# Patient Record
Sex: Female | Born: 1970 | Race: Black or African American | Hispanic: No | Marital: Married | Smoking: Never smoker
Health system: Southern US, Community
[De-identification: ages and names within clinical notes are randomized; demographics above are authoritative.]

## PROBLEM LIST (undated history)

## (undated) DIAGNOSIS — Z8719 Personal history of other diseases of the digestive system: Secondary | ICD-10-CM

## (undated) DIAGNOSIS — Z5189 Encounter for other specified aftercare: Secondary | ICD-10-CM

## (undated) DIAGNOSIS — K5090 Crohn's disease, unspecified, without complications: Secondary | ICD-10-CM

## (undated) DIAGNOSIS — R0602 Shortness of breath: Secondary | ICD-10-CM

## (undated) DIAGNOSIS — K219 Gastro-esophageal reflux disease without esophagitis: Secondary | ICD-10-CM

## (undated) DIAGNOSIS — I10 Essential (primary) hypertension: Secondary | ICD-10-CM

## (undated) HISTORY — PX: OTHER SURGICAL HISTORY: SHX169

---

## 1999-12-01 DIAGNOSIS — Z5189 Encounter for other specified aftercare: Secondary | ICD-10-CM

## 1999-12-01 DIAGNOSIS — IMO0001 Reserved for inherently not codable concepts without codable children: Secondary | ICD-10-CM

## 1999-12-01 HISTORY — DX: Encounter for other specified aftercare: Z51.89

## 1999-12-01 HISTORY — DX: Reserved for inherently not codable concepts without codable children: IMO0001

## 2001-02-15 ENCOUNTER — Emergency Department (HOSPITAL_COMMUNITY): Admission: EM | Admit: 2001-02-15 | Discharge: 2001-02-15 | Payer: Self-pay | Admitting: *Deleted

## 2001-02-19 ENCOUNTER — Inpatient Hospital Stay (HOSPITAL_COMMUNITY): Admission: AD | Admit: 2001-02-19 | Discharge: 2001-02-19 | Payer: Self-pay | Admitting: *Deleted

## 2001-03-17 ENCOUNTER — Encounter: Payer: Self-pay | Admitting: Obstetrics

## 2001-03-17 ENCOUNTER — Inpatient Hospital Stay (HOSPITAL_COMMUNITY): Admission: AD | Admit: 2001-03-17 | Discharge: 2001-03-17 | Payer: Self-pay | Admitting: *Deleted

## 2001-05-20 ENCOUNTER — Inpatient Hospital Stay (HOSPITAL_COMMUNITY): Admission: AD | Admit: 2001-05-20 | Discharge: 2001-05-20 | Payer: Self-pay | Admitting: *Deleted

## 2001-10-03 ENCOUNTER — Inpatient Hospital Stay: Admission: AD | Admit: 2001-10-03 | Discharge: 2001-10-03 | Payer: Self-pay | Admitting: Obstetrics and Gynecology

## 2001-10-06 ENCOUNTER — Inpatient Hospital Stay (HOSPITAL_COMMUNITY): Admission: AD | Admit: 2001-10-06 | Discharge: 2001-10-09 | Payer: Self-pay | Admitting: Obstetrics and Gynecology

## 2001-10-06 ENCOUNTER — Inpatient Hospital Stay (HOSPITAL_COMMUNITY): Admission: AD | Admit: 2001-10-06 | Discharge: 2001-10-06 | Payer: Self-pay | Admitting: Obstetrics and Gynecology

## 2002-04-14 ENCOUNTER — Other Ambulatory Visit: Admission: RE | Admit: 2002-04-14 | Discharge: 2002-04-14 | Payer: Self-pay | Admitting: Obstetrics and Gynecology

## 2002-05-29 ENCOUNTER — Encounter: Admission: RE | Admit: 2002-05-29 | Discharge: 2002-08-27 | Payer: Self-pay | Admitting: Internal Medicine

## 2002-11-15 ENCOUNTER — Emergency Department (HOSPITAL_COMMUNITY): Admission: EM | Admit: 2002-11-15 | Discharge: 2002-11-16 | Payer: Self-pay | Admitting: Emergency Medicine

## 2003-01-31 ENCOUNTER — Encounter: Payer: Self-pay | Admitting: Emergency Medicine

## 2003-01-31 ENCOUNTER — Emergency Department (HOSPITAL_COMMUNITY): Admission: EM | Admit: 2003-01-31 | Discharge: 2003-01-31 | Payer: Self-pay | Admitting: Emergency Medicine

## 2003-09-28 ENCOUNTER — Encounter: Admission: RE | Admit: 2003-09-28 | Discharge: 2003-09-28 | Payer: Self-pay | Admitting: Internal Medicine

## 2005-12-29 ENCOUNTER — Inpatient Hospital Stay (HOSPITAL_COMMUNITY): Admission: AD | Admit: 2005-12-29 | Discharge: 2005-12-29 | Payer: Self-pay | Admitting: Obstetrics

## 2006-07-04 ENCOUNTER — Encounter (INDEPENDENT_AMBULATORY_CARE_PROVIDER_SITE_OTHER): Payer: Self-pay | Admitting: *Deleted

## 2006-07-04 ENCOUNTER — Inpatient Hospital Stay (HOSPITAL_COMMUNITY): Admission: AD | Admit: 2006-07-04 | Discharge: 2006-07-06 | Payer: Self-pay | Admitting: Obstetrics & Gynecology

## 2006-07-05 ENCOUNTER — Encounter (INDEPENDENT_AMBULATORY_CARE_PROVIDER_SITE_OTHER): Payer: Self-pay | Admitting: *Deleted

## 2006-07-22 ENCOUNTER — Inpatient Hospital Stay (HOSPITAL_COMMUNITY): Admission: AD | Admit: 2006-07-22 | Discharge: 2006-07-25 | Payer: Self-pay | Admitting: Obstetrics

## 2008-09-25 ENCOUNTER — Other Ambulatory Visit: Admission: RE | Admit: 2008-09-25 | Discharge: 2008-09-25 | Payer: Self-pay | Admitting: Family Medicine

## 2008-10-24 ENCOUNTER — Encounter: Admission: RE | Admit: 2008-10-24 | Discharge: 2008-10-24 | Payer: Self-pay | Admitting: Family Medicine

## 2009-02-28 ENCOUNTER — Encounter: Admission: RE | Admit: 2009-02-28 | Discharge: 2009-02-28 | Payer: Self-pay | Admitting: Family Medicine

## 2011-04-17 NOTE — Op Note (Signed)
NAME:  [name], [name]                 ACCOUNT NO.:  [id number]   MEDICAL RECORD NO.:  [id number]          PATIENT TYPE:  INP   LOCATION:  9115                          FACILITY:  [facility]   PHYSICIAN:  Charles A. Clearance Coots, M.D.DATE OF BIRTH:  [birth year]   DATE OF PROCEDURE:  07/06/2006  DATE OF DISCHARGE:                                 OPERATIVE REPORT   PREOPERATIVE DIAGNOSIS:  Desires permanent sterilization.   POSTOPERATIVE DIAGNOSIS:  Desires permanent sterilization.   PROCEDURE:  Bilateral partial salpingectomy (Pomeroy technique).   ANESTHESIA:  Epidural.   ESTIMATED BLOOD LOSS:  Negligible.   COMPLICATIONS:  None.   SPECIMENS:  Approximately 2-cm segments of right and left fallopian tubes.   OPERATION:  The patient was brought to the operating room and after  satisfactory redosing of the epidural, the abdomen was prepped and draped in  the usual sterile fashion.  A small inferior umbilical incision was made  with the scalpel that was deepened down to the fascia with curved Mayo  scissors.  The fascia was grasped in the midline with Kelley forceps and was  cut transversely with curved Mayo scissors.  Fascial incision was extended  to the left and to the right with curved Mayo scissors.  Peritoneum was  grasped with hemostats and was incised with Metzenbaum scissors and right-  angle retractors were placed in the incision.  The right fallopian tube was  identified.  It was grasped with the Babcock clamp.  Tube was followed from  the cornual end of the fimbrial end and grasped with  Babcock clamps and was  then regrasped in the isthmic portion of the tube with the Babcock clamp.  A  knuckle of tube beneath the Babcock clamp was doubly ligated with #1 plain  catgut and the section of tube above the knot was excised with Metzenbaum  scissors and submitted to pathology for evaluation.  There was no active  bleeding from the tubal stumps and they were, therefore, placed back in  their  normal anatomic position.  The same procedure was performed on the  opposite side without complications.  The peritoneum and fascia was then  grasped with Nicholaus Bloom forceps and the peritoneum and fascia were closed as one  with a continuous suture of 2-0 Vicryl.  The subcutaneous tissue was  approximated with interrupted sutures of 2-0 Vicryl.  The skin was closed  with continuous subcuticular suture of 3-0 Monocryl.  Sterile bandage was  applied to the incision closure.  The patient tolerated the procedure well  and was transported to the recovery room in satisfactory condition.      Charles A. Clearance Coots, M.D.  Electronically Signed     CAH/MEDQ  D:  07/06/2006  T:  07/06/2006  Job:  [id number]

## 2011-04-17 NOTE — Discharge Summary (Signed)
NAME:  [name], [name]                 ACCOUNT NO.:  [id number]   MEDICAL RECORD NO.:  [id number]          PATIENT TYPE:  INP   LOCATION:  9319                          FACILITY:  [facility]   PHYSICIAN:  Roseanna Rainbow, M.D.DATE OF BIRTH:  1971/04/06   DATE OF ADMISSION:  07/22/2006  DATE OF DISCHARGE:  07/25/2006                                 DISCHARGE SUMMARY   CHIEF COMPLAINT:  The patient is a 40 year old with elevated blood pressures  and visual disturbances.   HISTORY OF PRESENT ILLNESS:  The patient is status post a spontaneous  vaginal delivery on July 04, 2006.  She also has a history of chronic  hypertension.  The patient reports scotomata.  She denies any other  neurological symptoms.   ALLERGIES:  NO KNOWN DRUG ALLERGIES.   MEDICATIONS:  Prenatal vitamins, Keflex.   PAST SURGICAL HISTORY:  Tonsils and adenoids.   PAST MEDICAL HISTORY:  Please see the above.   PHYSICAL EXAM:  VITAL SIGNS: Temperature 99, pulse 66, respiratory rate 16,  blood pressure 183/103.  GENERAL APPEARANCE:  Well-nourished, well-developed.  HEAD, EYES, EARS, NOSE AND THROAT:  Normocephalic.  NECK:  Supple.  LUNGS: Clear to auscultation bilaterally.  HEART:  Regular rate and rhythm.  ABDOMEN:  Nontender.  PELVIC:  Exam deferred.  EXTREMITIES:  No edema.  Deep tendon reflexes, patellar 2+.  SKIN:  Without rash.   LABS:  SGOT, SGPT 43 and 27 respectively.  Uric acid 6, hemoglobin 13,  platelets 411,000.   ASSESSMENT:  Preeclampsia postpartum.   PLAN:  Was admission and parenteral magnesium sulfate for seizure  prophylaxis.   HOSPITAL COURSE:  The patient was admitted.  She also was complaining of  migraine headache which responded to p.o. analgesics.  Her blood pressures  were of 130s to 140s/70s to 90s on hospital day #1.  The magnesium sulfate  was continued.  On hospital day #2, she complained of epigastric pain and  nausea and vomiting.  She had some periumbilical tenderness on  exam.  PIH  labs were continued and an abdominal ultrasound was obtained.  She also has  a history of Crohn's disease that has been quiescent.  The gastrointestinal  complaints resolved.  The magnesium sulfate was discontinued.  Her abdominal  ultrasound was normal.  Her PIH labs were normal.  Magnesium level was 8.2  prior to discontinuing the magnesium.  She was subsequently discharged to  home.   DISCHARGE DIAGNOSIS:  Postpartum preeclampsia, condition stable.   DIET:  Regular.   ACTIVITY:  Modified bed rest.   MEDICATIONS:  Resume home medications, Dilaudid as needed for headache.   DISPOSITION:  The patient was to follow up in the office in several days.      Roseanna Rainbow, M.D.  Electronically Signed     LAJ/MEDQ  D:  08/13/2006  T:  08/13/2006  Job:  [id number]

## 2011-04-17 NOTE — H&P (Signed)
[facility]  PatientJASSMINE, [name] Visit Number: [id number] MRN: [id number]          Service Type: OBS Location: [facility] Attending Physician:  Jaymes Graff A Dictated by:   Saverio Danker, C.N.M. Admit Date:  10/06/2001                           History and Physical  CHIEF COMPLAINT:              [name] is a 40 year old married black female, gravida 2 para 0, 0-1-0, at 37 weeks, who presents complaining of leaking copious amounts of clear fluid at approximately 10 p.m. this evening.  HISTORY OF PRESENT ILLNESS:   She denies any headaches or blurred vision, and she denies any nausea or vomiting.  She does report some mild irregular uterine contractions.  She reports positive fetal movement.  Her pregnancy has been followed at [facility] by the M.D. service and has been at risk for a history of positive group B strep and history of chronic hypertension, with normal PIH laboratories today, negative proteinuria today, and history of Crohns disease.  PAST OB/GYN HISTORY:          1. Gravida 2 para 0, 0-1-0.                               2. SAB or miscarriage in 1989 with no                                  complications.  ALLERGIES:                    No known drug allergies.  PAST MEDICAL HISTORY:         1. She reports having had the usual childhood                                  diseases.                               2. She reports a history of chronic hypertension                                  and was on meds prior to this pregnancy.                               3. She also reports a history of anemia and was                                  on iron prior to this pregnancy, and did                                  receive blood transfusion three times - at  age 69, 51, and 89, for Crohns disease.  PAST SURGICAL HISTORY:        1. Tonsillectomy at age 64.  2. D&C in 59.                               3. Wisdom teeth, age 74.  FAMILY HISTORY:               Significant for multiple members with chronic hypertension.  Mother with varicosities.  Maternal first cousin with prostate cancer.  Paternal grandfather and paternal uncle with migraines.  GENETIC HISTORY:              Her genetic history is essentially negative.  SOCIAL HISTORY:               She is married to Ivory Broad, who is involved and supportive.  They are of the [place] faith.  They deny any illicit drug use, alcohol, or smoking with this pregnancy.  PRENATAL LABORATORY DATA:     Her blood type is O-positive.  Her antibody screen is negative.  Sickle cell trait is negative.  Syphilis nonreactive. Rubella immune.  Hepatitis B surface antigen negative.  HIV nonreactive. Group B strep positive.  GC and Chlamydia both negative.  Pap smear within normal limits.  One hour glucola within normal range.  Maternal serum alpha fetoprotein was also within normal range.  PHYSICAL EXAMINATION:  VITAL SIGNS:                  Stable.  She is afebrile.  Her blood pressure on admission is 145/101 and a few minutes after admission 128/92.  HEENT:                        Grossly within normal limits.  HEART:                        Regular rhythm and rate.  CHEST:                        Clear.  BREAST:                       Soft, nontender.  ABDOMEN:                      Gravid with uterine contractions every 2-1/2 to 4-1/2 minutes that are mild.  PELVIC:                       Cervix 2 cm, 80%, vertex -2 with positive ferning and nitrazine, and copious fluid noted.  EXTREMITIES:                  Within normal limits.  ASSESSMENT:                   1. Intrauterine pregnancy at term.                               2. Positive group B streptococci.                               3. Spontaneous rupture of membranes with  clear fluid at 10 p.m.                                4. Chronic hypertension.  PLAN:                         Admit to labor and delivery to follow routine M.D. orders, give penicillin for group B strep prophylaxis.  Dr. Dierdre Forth has been notified of the patients admission and will follow the patient. Dictated by:   Vance Gather Duplantis, C.N.M. Attending Physician:  Michael Litter DD:  10/06/01 TD:  10/07/01 Job: [id number]

## 2011-12-09 ENCOUNTER — Other Ambulatory Visit: Payer: Self-pay | Admitting: Obstetrics and Gynecology

## 2011-12-09 DIAGNOSIS — Z1231 Encounter for screening mammogram for malignant neoplasm of breast: Secondary | ICD-10-CM

## 2011-12-21 ENCOUNTER — Ambulatory Visit
Admission: RE | Admit: 2011-12-21 | Discharge: 2011-12-21 | Disposition: A | Discharge status: Home / Self Care | Payer: PRIVATE HEALTH INSURANCE | Source: Ambulatory Visit | Attending: Obstetrics and Gynecology | Admitting: Obstetrics and Gynecology

## 2011-12-21 DIAGNOSIS — Z1231 Encounter for screening mammogram for malignant neoplasm of breast: Secondary | ICD-10-CM

## 2012-02-15 ENCOUNTER — Other Ambulatory Visit: Payer: Self-pay | Admitting: Urology

## 2012-04-04 ENCOUNTER — Encounter (HOSPITAL_COMMUNITY): Payer: Self-pay | Admitting: Pharmacist

## 2012-04-13 ENCOUNTER — Encounter (HOSPITAL_COMMUNITY)
Admission: RE | Admit: 2012-04-13 | Discharge: 2012-04-13 | Disposition: A | Discharge status: Home / Self Care | Payer: PRIVATE HEALTH INSURANCE | Source: Ambulatory Visit | Attending: Obstetrics and Gynecology | Admitting: Obstetrics and Gynecology

## 2012-04-13 ENCOUNTER — Encounter (HOSPITAL_COMMUNITY): Payer: Self-pay

## 2012-04-13 HISTORY — DX: Crohn's disease, unspecified, without complications: K50.90

## 2012-04-13 HISTORY — DX: Shortness of breath: R06.02

## 2012-04-13 HISTORY — DX: Encounter for other specified aftercare: Z51.89

## 2012-04-13 HISTORY — DX: Personal history of other diseases of the digestive system: Z87.19

## 2012-04-13 HISTORY — DX: Essential (primary) hypertension: I10

## 2012-04-13 HISTORY — DX: Gastro-esophageal reflux disease without esophagitis: K21.9

## 2012-04-13 LAB — BASIC METABOLIC PANEL
BUN: 12 mg/dL (ref 6–23)
CO2: 24 mEq/L (ref 19–32)
Calcium: 9.5 mg/dL (ref 8.4–10.5)
Chloride: 102 mEq/L (ref 96–112)
Creatinine, Ser: 0.93 mg/dL (ref 0.50–1.10)
Glucose, Bld: 93 mg/dL (ref 70–99)

## 2012-04-13 LAB — CBC
HCT: 37.2 % (ref 36.0–46.0)
MCH: 29.4 pg (ref 26.0–34.0)
MCV: 90.5 fL (ref 78.0–100.0)
Platelets: 412 10*3/uL — ABNORMAL HIGH (ref 150–400)
RBC: 4.11 MIL/uL (ref 3.87–5.11)

## 2012-04-13 NOTE — Patient Instructions (Addendum)
19 [name]  04/13/2012   Your procedure is scheduled on:  04/22/12  Enter through the Main Entrance of [facility] at 6 AM.  Pick up the phone at the desk and dial [phone].   Call this number if you have problems the morning of surgery: [phone]   Remember:   Do not eat food:After Midnight.  Do not drink clear liquids: After Midnight.  Take these medicines the morning of surgery with A SIP OF WATER: HCTZ   Do not wear jewelry, make-up or nail polish.  Do not wear lotions, powders, or perfumes. You may wear deodorant.  Do not shave 48 hours prior to surgery.  Do not bring valuables to the hospital.  Contacts, dentures or bridgework may not be worn into surgery.  Leave suitcase in the car. After surgery it may be brought to your room.  For patients admitted to the hospital, checkout time is 11:00 AM the day of discharge.   Patients discharged the day of surgery will not be allowed to drive home.  Name and phone number of your driver: NA  Special Instructions: CHG Shower Use Special Wash: 1/2 bottle night before surgery and 1/2 bottle morning of surgery.   Please read over the following fact sheets that you were given: MRSA Information

## 2012-04-13 NOTE — Pre-Procedure Instructions (Addendum)
Call to anes informing Dr. Jean Rosenthal of pts complaint of shortness of breath when climbing stairs and non-compliance in taking HCTZ.  Diastolics in the high 90's. No anes. Pre-op interview needed per Brayton Caves, MD  Patton Salles, RN    Instructed patient to take BP medication as prescribed.  Patton Salles RN

## 2012-04-21 MED ORDER — CEFAZOLIN SODIUM-DEXTROSE 2-3 GM-% IV SOLR
2.0000 g | INTRAVENOUS | Status: AC
  Administered 2012-04-22: 2 g via INTRAVENOUS
  Filled 2012-04-21: qty 50

## 2012-04-21 NOTE — H&P (Signed)
[name] is an 41 y.o. female G2P2 who is coming in for a scheduled LSH and suburethral sling for ongoing issues with menorrhagia and SUI.  Pt began to have heavy regular cycles over the last year with flooding.  Her bleeding then progressed to daily bleeding that has not responded to Aygestin or generess OCP's.  US revealed fibroids and probale adenomyosis.  EMB was benign.  She is seeking definitive surgical therapy with a hysterectomy.  She also reported significant SUI and has had a w/u with Dr. Vernie Ammons so she can proceed with a concurrent sling procedure.  Pertinent Gynecological History: No abnormal paps  Menstrual History:  No LMP recorded.    Past Medical History  Diagnosis Date  . Hypertension   . Blood transfusion [address], [place]  . Shortness of breath   . H/O hiatal hernia   . Crohn's disease   . GERD (gastroesophageal reflux disease)     No meds    Past Surgical History  Procedure Date  . Vag del x2     No family history on file.  Social History:  reports that she has never smoked. She does not have any smokeless tobacco history on file. She reports that she drinks alcohol. She reports that she does not use illicit drugs.  Allergies: No Known Allergies  No prescriptions prior to admission    ROS  There were no vitals taken for this visit. Physical Exam  Constitutional: She is oriented to person, place, and time. She appears well-developed and well-nourished.  Cardiovascular: Normal rate and regular rhythm.   Respiratory: Effort normal and breath sounds normal.  GI: Soft. Bowel sounds are normal.  Genitourinary: Vagina normal and uterus normal.       Uterus ULN  Neurological: She is alert and oriented to person, place, and time.  Psychiatric: She has a normal mood and affect. Her behavior is normal.    No results found for this or any previous visit (from the past 24 hour(s)).  No results found.  Assessment/Plan: The patient was  counseled on risks and benefits of LSH surgery including bleeding, infection and possible damage to bowel and bladder.  Pt will do a bowel prep the night before surgery.  She understands the need to convert to an open approach in the event of a complication and that this would possibly delay recovery. She desires to proceed.  Oliver Pila 04/21/2012, 8:36 PM

## 2012-04-21 NOTE — Consult Note (Signed)
History of Present Illness        [name] is a 41 year old female patient of Dr. Senaida Ores who was seen in office consultation for further evaluation of urinary incontinence. The patient was found, by urodynamics, to have stress urinary incontinence with no detrusor instability. She was found on this study, done in 1/13, to have pure stress urinary incontinence with a normal bladder capacity and no detrusor instability. The patient reports she's having significant bleeding per vagina and is scheduled to undergo a hysterectomy by Dr. Senaida Ores. She's also having very significant stress urinary incontinence that occurs with sneezing, laughing, coughing and heavy lifting. She has no nocturia or daytime urgency. She has no history of chronic UTIs. She has tried Kegel exercises and these have been ineffective for her. Her leakage is of moderate severity with no associated signs and symptoms.   Past Medical History Problems  1. History of  Anemia 285.9 2. History of  Esophageal Reflux 530.81 3. History of  Gastric Ulcer 531.90 4. History of  Hyperlipidemia 272.4 5. History of  Hypertension 401.9  Surgical History Problems  1. History of  No Surgical Problems  Current Meds 1. ALPRAZolam 0.25 MG Oral Tablet; Therapy: (Recorded:06Mar2013) to 2. Benicar 40 MG Oral Tablet; Therapy: (Recorded:06Mar2013) to 3. Centrum Silver TABS; Therapy: (Recorded:06Mar2013) to 4. Iron TABS; Therapy: (Recorded:06Mar2013) to 5. Norethindrone Acetate 5 MG Oral Tablet; Therapy: (Recorded:06Mar2013) to  Allergies No Known Allergies  1. No Known Allergies  Family History Problems  1. Fraternal history of  Cholelithiasis 2. Paternal history of  Diabetes Mellitus V18.0 3. Family history of  Family Health Status - Mother's Age 32. Family history of  Family Health Status Father 5. Family history of  Family Health Status Number Of Children 6. Maternal history of  Hypertension V17.49 7. Sororal history of  Hypertension  V17.49  Social History Problems    Alcohol Use   Caffeine Use   Marital History - Currently Married   Never A Smoker Denied    History of  Occupation:  Review of Systems Genitourinary, constitutional, skin, eye, otolaryngeal, hematologic/lymphatic, cardiovascular, pulmonary, endocrine, musculoskeletal, gastrointestinal, neurological and psychiatric system(s) were reviewed and pertinent findings if present are noted.  Genitourinary: urinary frequency, nocturia and incontinence.  Gastrointestinal: melena.  Constitutional: feeling tired (fatigue).    Vitals Vital Signs BMI Calculated: 27.22 BSA Calculated: 1.56 Height: 4 ft 11 in Weight: 135 lb  Blood Pressure: 143 / 95, RUE, Sitting Temperature: 98.1 F, Oral Heart Rate: 82  Physical Exam Constitutional: Well nourished and well developed . No acute distress.  ENT:. The ears and nose are normal in appearance.  Neck: The appearance of the neck is normal and no neck mass is present.  Pulmonary: No respiratory distress and normal respiratory rhythm and effort.  Cardiovascular: Heart rate and rhythm are normal . No peripheral edema.  Abdomen: The abdomen is soft and nontender. No masses are palpated. No CVA tenderness. No hernias are palpable. No hepatosplenomegaly noted.  Lymphatics: The femoral and inguinal nodes are not enlarged or tender.  Skin: Normal skin turgor, no visible rash and no visible skin lesions.  Neuro/Psych:. Mood and affect are appropriate.    Results/Data Urine COLOR YELLOW   APPEARANCE CLEAR   SPECIFIC GRAVITY 1.020   pH 6.0   GLUCOSE NEG mg/dL  BILIRUBIN NEG   KETONE NEG mg/dL  BLOOD TRACE   PROTEIN NEG mg/dL  UROBILINOGEN 0.2 mg/dL  NITRITE NEG   LEUKOCYTE ESTERASE NEG   SQUAMOUS EPITHELIAL/HPF FEW  WBC 0-3 WBC/hpf  RBC 0-3 RBC/hpf  BACTERIA NONE SEEN   CRYSTALS NONE SEEN   CASTS NONE SEEN    The following images/tracing/specimen were independently visualized:  I reviewed her  urodynamics done by Dr. Senaida Ores which revealed his point pressure of 157-175 with urethral hypermobility and he functional bladder capacity of 528 cc. No detrusor instability or outlet obstruction was noted.    Assessment Assessed  1. Female Stress Incontinence 625.6   I have discussed the procedure of the mid urethral sling with the patient and her husband today. I've given her a brochure outlining the procedure as well as its risks and complications and we went over each of the risks and complications individually today. I feel she would be an excellent candidate for a suprapubic sling and this can be performed at the time of her hysterectomy. She understands and would like to proceed with that.   Plan    She will be scheduled for a suprapubic sling in conjunction with her hysterectomy by Dr. Senaida Ores.

## 2012-04-22 ENCOUNTER — Encounter (HOSPITAL_COMMUNITY): Payer: Self-pay | Admitting: Anesthesiology

## 2012-04-22 ENCOUNTER — Ambulatory Visit (HOSPITAL_COMMUNITY): Payer: PRIVATE HEALTH INSURANCE | Admitting: Anesthesiology

## 2012-04-22 ENCOUNTER — Encounter (HOSPITAL_COMMUNITY)
Admission: RE | Disposition: A | Discharge status: Home / Self Care | Payer: Self-pay | Source: Ambulatory Visit | Attending: Obstetrics and Gynecology

## 2012-04-22 ENCOUNTER — Encounter (HOSPITAL_COMMUNITY): Payer: Self-pay | Admitting: *Deleted

## 2012-04-22 ENCOUNTER — Ambulatory Visit (HOSPITAL_COMMUNITY)
Admission: RE | Admit: 2012-04-22 | Discharge: 2012-04-22 | Disposition: A | Discharge status: Home / Self Care | Payer: PRIVATE HEALTH INSURANCE | Source: Ambulatory Visit | Attending: Obstetrics and Gynecology | Admitting: Obstetrics and Gynecology

## 2012-04-22 DIAGNOSIS — N393 Stress incontinence (female) (male): Secondary | ICD-10-CM | POA: Insufficient documentation

## 2012-04-22 DIAGNOSIS — N949 Unspecified condition associated with female genital organs and menstrual cycle: Secondary | ICD-10-CM | POA: Insufficient documentation

## 2012-04-22 DIAGNOSIS — N938 Other specified abnormal uterine and vaginal bleeding: Secondary | ICD-10-CM | POA: Insufficient documentation

## 2012-04-22 DIAGNOSIS — Z90710 Acquired absence of both cervix and uterus: Secondary | ICD-10-CM

## 2012-04-22 DIAGNOSIS — N946 Dysmenorrhea, unspecified: Secondary | ICD-10-CM | POA: Insufficient documentation

## 2012-04-22 DIAGNOSIS — N920 Excessive and frequent menstruation with regular cycle: Secondary | ICD-10-CM | POA: Insufficient documentation

## 2012-04-22 HISTORY — PX: CYSTOSCOPY: SHX5120

## 2012-04-22 HISTORY — PX: LAPAROSCOPIC SUPRACERVICAL HYSTERECTOMY: SHX5399

## 2012-04-22 HISTORY — PX: PUBOVAGINAL SLING: SHX1035

## 2012-04-22 LAB — TYPE AND SCREEN: Antibody Screen: NEGATIVE

## 2012-04-22 SURGERY — HYSTERECTOMY, SUPRACERVICAL, LAPAROSCOPIC
Anesthesia: General | Site: Vagina | Wound class: Clean Contaminated

## 2012-04-22 MED ORDER — ONDANSETRON HCL 4 MG/2ML IJ SOLN
INTRAMUSCULAR | Status: AC
  Filled 2012-04-22: qty 2

## 2012-04-22 MED ORDER — FENTANYL CITRATE 0.05 MG/ML IJ SOLN
INTRAMUSCULAR | Status: DC | PRN
  Administered 2012-04-22: 250 ug via INTRAVENOUS
  Administered 2012-04-22: 100 ug via INTRAVENOUS
  Administered 2012-04-22: 150 ug via INTRAVENOUS

## 2012-04-22 MED ORDER — BUPIVACAINE HCL (PF) 0.25 % IJ SOLN
INTRAMUSCULAR | Status: AC
  Filled 2012-04-22: qty 30

## 2012-04-22 MED ORDER — ONDANSETRON HCL 4 MG/2ML IJ SOLN
INTRAMUSCULAR | Status: DC | PRN
  Administered 2012-04-22: 4 mg via INTRAVENOUS

## 2012-04-22 MED ORDER — MIDAZOLAM HCL 5 MG/5ML IJ SOLN
INTRAMUSCULAR | Status: DC | PRN
  Administered 2012-04-22: 2 mg via INTRAVENOUS

## 2012-04-22 MED ORDER — OXYCODONE-ACETAMINOPHEN 5-325 MG PO TABS: 1.0000 | ORAL_TABLET | ORAL | Status: AC | PRN

## 2012-04-22 MED ORDER — LACTATED RINGERS IR SOLN
Status: DC | PRN
  Administered 2012-04-22: 3000 mL

## 2012-04-22 MED ORDER — SODIUM CHLORIDE 0.9 % IJ SOLN
INTRAMUSCULAR | Status: DC | PRN
  Administered 2012-04-22: 5 mL

## 2012-04-22 MED ORDER — GLYCOPYRROLATE 0.2 MG/ML IJ SOLN
INTRAMUSCULAR | Status: DC | PRN
  Administered 2012-04-22: .5 mg via INTRAVENOUS
  Administered 2012-04-22: 4 mg via INTRAVENOUS

## 2012-04-22 MED ORDER — EPHEDRINE SULFATE 50 MG/ML IJ SOLN
INTRAMUSCULAR | Status: DC | PRN
  Administered 2012-04-22: 10 mg via INTRAVENOUS
  Administered 2012-04-22: 5 mg via INTRAVENOUS

## 2012-04-22 MED ORDER — LIDOCAINE HCL (CARDIAC) 20 MG/ML IV SOLN
INTRAVENOUS | Status: AC
  Filled 2012-04-22: qty 5

## 2012-04-22 MED ORDER — HYDROMORPHONE HCL PF 1 MG/ML IJ SOLN
0.2000 mg | INTRAMUSCULAR | Status: DC | PRN
  Administered 2012-04-22 (×2): 0.5 mg via INTRAVENOUS
  Filled 2012-04-22: qty 1

## 2012-04-22 MED ORDER — PROPOFOL 10 MG/ML IV EMUL
INTRAVENOUS | Status: DC | PRN
  Administered 2012-04-22: 50 mg via INTRAVENOUS
  Administered 2012-04-22: 150 mg via INTRAVENOUS

## 2012-04-22 MED ORDER — MIDAZOLAM HCL 2 MG/2ML IJ SOLN
INTRAMUSCULAR | Status: AC
  Filled 2012-04-22: qty 2

## 2012-04-22 MED ORDER — HYDROMORPHONE HCL PF 1 MG/ML IJ SOLN
INTRAMUSCULAR | Status: AC
  Administered 2012-04-22: 0.5 mg via INTRAVENOUS
  Filled 2012-04-22: qty 1

## 2012-04-22 MED ORDER — FENTANYL CITRATE 0.05 MG/ML IJ SOLN
INTRAMUSCULAR | Status: AC
  Filled 2012-04-22: qty 5

## 2012-04-22 MED ORDER — DEXAMETHASONE SODIUM PHOSPHATE 4 MG/ML IJ SOLN
INTRAMUSCULAR | Status: DC | PRN
  Administered 2012-04-22: 10 mg via INTRAVENOUS

## 2012-04-22 MED ORDER — LACTATED RINGERS IV SOLN
INTRAVENOUS | Status: DC
  Administered 2012-04-22 (×3): via INTRAVENOUS

## 2012-04-22 MED ORDER — HYDROMORPHONE HCL PF 1 MG/ML IJ SOLN
0.2500 mg | INTRAMUSCULAR | Status: DC | PRN
  Administered 2012-04-22 (×2): 0.5 mg via INTRAVENOUS

## 2012-04-22 MED ORDER — IBUPROFEN 600 MG PO TABS
600.0000 mg | ORAL_TABLET | Freq: Four times a day (QID) | ORAL | Status: DC | PRN
  Administered 2012-04-22: 600 mg via ORAL
  Filled 2012-04-22: qty 1

## 2012-04-22 MED ORDER — HYDROMORPHONE HCL PF 1 MG/ML IJ SOLN
INTRAMUSCULAR | Status: AC
  Filled 2012-04-22: qty 1

## 2012-04-22 MED ORDER — ONDANSETRON HCL 4 MG/2ML IJ SOLN: 4.0000 mg | Freq: Four times a day (QID) | INTRAMUSCULAR | Status: DC | PRN

## 2012-04-22 MED ORDER — ROCURONIUM BROMIDE 50 MG/5ML IV SOLN
INTRAVENOUS | Status: AC
  Filled 2012-04-22: qty 1

## 2012-04-22 MED ORDER — LIDOCAINE HCL (CARDIAC) 20 MG/ML IV SOLN
INTRAVENOUS | Status: DC | PRN
  Administered 2012-04-22 (×2): 50 mg via INTRAVENOUS

## 2012-04-22 MED ORDER — EPHEDRINE 5 MG/ML INJ
INTRAVENOUS | Status: AC
  Filled 2012-04-22: qty 10

## 2012-04-22 MED ORDER — OXYCODONE-ACETAMINOPHEN 5-325 MG PO TABS
1.0000 | ORAL_TABLET | ORAL | Status: DC | PRN
  Administered 2012-04-22: 1 via ORAL
  Filled 2012-04-22: qty 2

## 2012-04-22 MED ORDER — BUPIVACAINE HCL (PF) 0.25 % IJ SOLN
INTRAMUSCULAR | Status: DC | PRN
  Administered 2012-04-22: 9 mL

## 2012-04-22 MED ORDER — ONDANSETRON HCL 4 MG PO TABS: 4.0000 mg | ORAL_TABLET | Freq: Four times a day (QID) | ORAL | Status: DC | PRN

## 2012-04-22 MED ORDER — NEOSTIGMINE METHYLSULFATE 1 MG/ML IJ SOLN
INTRAMUSCULAR | Status: DC | PRN
  Administered 2012-04-22 (×2): 2.5 mg via INTRAVENOUS

## 2012-04-22 MED ORDER — FENTANYL CITRATE 0.05 MG/ML IJ SOLN
INTRAMUSCULAR | Status: AC
  Filled 2012-04-22: qty 2

## 2012-04-22 MED ORDER — PROPOFOL 10 MG/ML IV EMUL
INTRAVENOUS | Status: AC
  Filled 2012-04-22: qty 20

## 2012-04-22 MED ORDER — IBUPROFEN 600 MG PO TABS: 600.0000 mg | ORAL_TABLET | Freq: Four times a day (QID) | ORAL | Status: AC | PRN

## 2012-04-22 MED ORDER — STERILE WATER FOR IRRIGATION IR SOLN
Status: DC | PRN
  Administered 2012-04-22: 1000 mL via INTRAVESICAL

## 2012-04-22 MED ORDER — LIDOCAINE-EPINEPHRINE (PF) 1 %-1:200000 IJ SOLN
INTRAMUSCULAR | Status: AC
  Filled 2012-04-22: qty 10

## 2012-04-22 MED ORDER — LACTATED RINGERS IV SOLN: INTRAVENOUS | Status: DC

## 2012-04-22 MED ORDER — SIMETHICONE 80 MG PO CHEW: 80.0000 mg | CHEWABLE_TABLET | Freq: Four times a day (QID) | ORAL | Status: DC | PRN

## 2012-04-22 MED ORDER — ROCURONIUM BROMIDE 100 MG/10ML IV SOLN
INTRAVENOUS | Status: DC | PRN
  Administered 2012-04-22: 40 mg via INTRAVENOUS

## 2012-04-22 MED ORDER — DOCUSATE SODIUM 100 MG PO CAPS: 100.0000 mg | ORAL_CAPSULE | Freq: Two times a day (BID) | ORAL | Status: DC

## 2012-04-22 MED ORDER — DEXAMETHASONE SODIUM PHOSPHATE 10 MG/ML IJ SOLN
INTRAMUSCULAR | Status: AC
  Filled 2012-04-22: qty 1

## 2012-04-22 MED ORDER — IRBESARTAN 300 MG PO TABS
300.0000 mg | ORAL_TABLET | Freq: Every day | ORAL | Status: DC
  Filled 2012-04-22 (×2): qty 1

## 2012-04-22 MED ORDER — LIDOCAINE-EPINEPHRINE (PF) 1 %-1:200000 IJ SOLN
INTRAMUSCULAR | Status: DC | PRN
  Administered 2012-04-22: 18 mL

## 2012-04-22 SURGICAL SUPPLY — 58 items
BARRIER ADHS 3X4 INTERCEED (GAUZE/BANDAGES/DRESSINGS) ×4 IMPLANT
BLADE LAPAROSCOPIC MORCELL KIT (BLADE) ×4 IMPLANT
BLADE SURG 15 STRL LF C SS BP (BLADE) ×3 IMPLANT
BLADE SURG 15 STRL SS (BLADE) ×1
CABLE HIGH FREQUENCY MONO STRZ (ELECTRODE) IMPLANT
CATH FOLEY 2WAY SLVR  5CC 16FR (CATHETERS)
CATH FOLEY 2WAY SLVR 5CC 16FR (CATHETERS) IMPLANT
CHLORAPREP W/TINT 26ML (MISCELLANEOUS) ×4 IMPLANT
CLOTH BEACON ORANGE TIMEOUT ST (SAFETY) ×4 IMPLANT
COVER MAYO STAND STRL (DRAPES) ×4 IMPLANT
DECANTER SPIKE VIAL GLASS SM (MISCELLANEOUS) ×8 IMPLANT
DERMABOND ADVANCED (GAUZE/BANDAGES/DRESSINGS) ×1
DERMABOND ADVANCED .7 DNX12 (GAUZE/BANDAGES/DRESSINGS) ×3 IMPLANT
DRAPE HYSTEROSCOPY (DRAPE) IMPLANT
ELECT REM PT RETURN 9FT ADLT (ELECTROSURGICAL) ×4
ELECTRODE REM PT RTRN 9FT ADLT (ELECTROSURGICAL) ×3 IMPLANT
EVACUATOR SMOKE 8.L (FILTER) ×12 IMPLANT
GAUZE PACKING IODOFORM 2 (PACKING) IMPLANT
GLOVE BIO SURGEON STRL SZ 6.5 (GLOVE) ×8 IMPLANT
GLOVE BIO SURGEON STRL SZ7 (GLOVE) ×8 IMPLANT
GLOVE BIO SURGEON STRL SZ7.5 (GLOVE) ×4 IMPLANT
GLOVE BIO SURGEON STRL SZ8 (GLOVE) ×8 IMPLANT
GLOVE ORTHO TXT STRL SZ7.5 (GLOVE) ×4 IMPLANT
GLOVE SURG SS PI 7.0 STRL IVOR (GLOVE) ×4 IMPLANT
GOWN PREVENTION PLUS LG XLONG (DISPOSABLE) IMPLANT
GOWN PREVENTION PLUS XLARGE (GOWN DISPOSABLE) IMPLANT
NEEDLE HYPO 22GX1.5 SAFETY (NEEDLE) ×8 IMPLANT
NEEDLE INSUFFLATION 14GA 120MM (NEEDLE) ×4 IMPLANT
NS IRRIG 1000ML POUR BTL (IV SOLUTION) ×8 IMPLANT
PACK LAPAROSCOPY BASIN (CUSTOM PROCEDURE TRAY) ×4 IMPLANT
PACK VAGINAL WOMENS (CUSTOM PROCEDURE TRAY) ×4 IMPLANT
PENCIL BUTTON HOLSTER BLD 10FT (ELECTRODE) ×4 IMPLANT
PLUG CATH AND CAP STER (CATHETERS) ×4 IMPLANT
SCALPEL HARMONIC ACE (MISCELLANEOUS) ×4 IMPLANT
SET CYSTO W/LG BORE CLAMP LF (SET/KITS/TRAYS/PACK) ×4 IMPLANT
SET IRRIG TUBING LAPAROSCOPIC (IRRIGATION / IRRIGATOR) ×4 IMPLANT
SLEEVE ADV FIXATION 5X100MM (TROCAR) ×8 IMPLANT
SLING SYSTEM SPARC (Sling) ×4 IMPLANT
SPONGE LAP 4X18 X RAY DECT (DISPOSABLE) ×4 IMPLANT
SUT ETHIBOND 0 (SUTURE) IMPLANT
SUT SILK 2 0 FS (SUTURE) IMPLANT
SUT VIC AB 2-0 SH 27 (SUTURE) ×1
SUT VIC AB 2-0 SH 27XBRD (SUTURE) ×3 IMPLANT
SUT VIC AB 2-0 UR6 27 (SUTURE) ×8 IMPLANT
SUT VIC AB 3-0 PS2 18 (SUTURE) ×2
SUT VIC AB 3-0 PS2 18XBRD (SUTURE) ×6 IMPLANT
SUT VICRYL 0 UR6 27IN ABS (SUTURE) ×4 IMPLANT
SYR BULB IRRIGATION 50ML (SYRINGE) ×4 IMPLANT
SYR CONTROL 10ML LL (SYRINGE) ×4 IMPLANT
TOWEL OR 17X24 6PK STRL BLUE (TOWEL DISPOSABLE) IMPLANT
TRAY FOLEY CATH 14FR (SET/KITS/TRAYS/PACK) ×4 IMPLANT
TROCAR Z-THREAD FIOS 12X100MM (TROCAR) ×4 IMPLANT
TROCAR Z-THREAD FIOS 5X100MM (TROCAR) ×4 IMPLANT
TUBING SUCTION BULK 100 FT (MISCELLANEOUS) ×4 IMPLANT
VICRYL PLUS/SUTURE ×4 IMPLANT
WARMER LAPAROSCOPE (MISCELLANEOUS) ×4 IMPLANT
WATER STERILE IRR 1000ML POUR (IV SOLUTION) ×8 IMPLANT
YANKAUER SUCT BULB TIP NO VENT (SUCTIONS) ×4 IMPLANT

## 2012-04-22 NOTE — Transfer of Care (Signed)
Immediate Anesthesia Transfer of Care Note  Patient: [name]  Procedure(s) Performed: Procedure(s) (LRB): LAPAROSCOPIC SUPRACERVICAL HYSTERECTOMY (N/A) PUBO-VAGINAL SLING (N/A) CYSTOSCOPY (N/A)  Patient Location: PACU  Anesthesia Type: General  Level of Consciousness: awake, alert  and oriented  Airway & Oxygen Therapy: Patient Spontanous Breathing and Patient connected to nasal cannula oxygen  Post-op Assessment: Report given to PACU RN and Post -op Vital signs reviewed and stable  Post vital signs: Reviewed and stable  Complications: No apparent anesthesia complications

## 2012-04-22 NOTE — Op Note (Signed)
PATIENT:  Elizebeth Brooking  PRE-OPERATIVE DIAGNOSIS: SUI  POST-OPERATIVE DIAGNOSIS:  Same  PROCEDURE: Sparc Suprapubic sling placement  SURGEON:  Surgeon: Garnett Farm  INDICATION: Mrs. Martinek is a 41 year old female with stress urinary incontinence. This was demonstrated on urodynamics as well as the absence of any detrusor instability. She has tried Kegel exercises but they have been of little benefit. She therefore has elected to proceed with surgical correction.  ANESTHESIA:  General  EBL:  less than 50 mL  DRAINS: None  DESCRIPTION OF PROCEDURE: After informed consent the patient was brought to the major OR and placed on the table. She underwent hysterectomy by Dr. Senaida Ores which has been dictated separately. In the dorsal lithotomy position and with a Foley catheter in in preparation for the sling procedure a second official timeout was performed.  A 16 French Foley catheter Had been placed in the bladder and the bladder was drained. A weighted speculum was then placed in the vagina and I injected Local anesthetic with epinephrine in the suburethral region. After allowing adequate time for epinephrine effect a midline incision was made over the urethra which was noted by palpation of the Foley catheter tubing and balloon. I then dissected beneath the vaginal mucosa laterally around the urethra and was then able to insert my index finger and palpate the undersurface of the symphysis pubis. The wound was irrigated with saline.  Attention was turned to the suprapubic region and I injected local anesthetic with epinephrine at 2 locations approximately 4 finger widths apart just above the palpated symphysis pubis. A stab incision was then made in these locations. I then made sure the bladder was completely empty by draining it through the Foley catheter. The suprapubic trocar was then passed through the skin incision, down behind the symphysis pubis and directed slightly laterally until it  was in the area of the vagina what was redirected medially and was palpated and directed out through the vaginal incision at the mid urethral level. This was performed first on the left and then the right side.  The Foley catheter was removed and cystoscopy was performed. The 22 French cystoscope with 70 lens was inserted in the bladder and the bladder was fully and systematically inspected. No tumor stones or inflammatory lesions were seen. Ureteral orifices were of normal configuration and position. There was no evidence of foreign body or perforation of the bladder noted. I therefore drained the bladder, removed the cystoscope and reinserted the Foley catheter.  The sling material was then affixed to the distal aspect of each of the trochars and withdrawn back through the suprapubic incisions. I then repeated the cystoscopy again noting no evidence of sling material or foreign body within the bladder. I therefore divided the sling sheath and passed antibiotic solution through the sheath on each side. The sheath was grasped with a hemostat and the sling was then withdrawn into a position at the mid urethra. I placed forceps beneath the urethra and then removed the sheathing first on the left side and then on the right-hand side. This was performed in such a manner that there was no tension noted on the sling material. The excess sling material was then excised at the skin level and the skin incisions were closed with Dermabond after irrigation with antibiotic solution.  Attention was then redirected to the vagina and the incision was irrigated copiously with antibiotic solution. I then closed the incision with running 2-0 Vicryl suture. The Foley catheter was left indwelling.  The patient tolerated the procedure well and there were no intraoperative complications.   PATIENT DISPOSITION:  PACU - hemodynamically stable.

## 2012-04-22 NOTE — Discharge Summary (Signed)
Physician Discharge Summary  Patient ID: [name] MRN: [id number] DOB/AGE: June 13, 1971 41 y.o.  Admit date: 04/22/2012 Discharge date: 04/22/2012  Admission Diagnoses:  Menorrhagia                                          Dysmenorrhea                                          Stress Urinary Incontinence  Discharge Diagnoses:  Same s/p LSH and suburethral sling placement  Discharged Condition: good  Hospital Course: Pt admitted for p[ost-operative care s/p an [facility] procedure and concomitant suburethral sling placement.  She did very well and on the evening of surgery had voided twice, was ambulating and tolerating po intake and pain meds.  She requested early d/c.     Treatments: Laparoscopic Supracervical Hysterectomy                       SPARC sling Discharge Exam: Blood pressure 118/75, pulse 87, temperature 98.3 F (36.8 C), temperature source Oral, resp. rate 18, height 4\' 11"  (1.499 m), weight 61.236 kg (135 lb), SpO2 99.00%. General appearance: alert and cooperative Cardio: regular rate and rhythm GI: soft, non-tender; bowel sounds normal; no masses,  no organomegaly Incisions dry and intact  Disposition:   Discharge Orders    Future Orders Please Complete By Expires   Diet - low sodium heart healthy      Increase activity slowly      Discharge instructions      Comments:   No heavy lifting more than 15 lbs.  May shower over incisions and pat dry.  Call for fever, persistent N/V or inability to void.   May walk up steps      May shower / Bathe      Sexual Activity Restrictions      Comments:   Nothing in vagina for 4-6 weeks     Medication List  As of 04/22/2012  7:31 PM   STOP taking these medications         ferrous sulfate 325 (65 FE) MG tablet      GENERESS FE 0.8-25 MG-MCG tablet         TAKE these medications         hydrochlorothiazide 25 MG tablet   Commonly known as: HYDRODIURIL   Take 25 mg by mouth as needed. Pt states she takes blood  pressure medication occasionally. Pharmacy filled a 30 day supply in Oct. 2012.      ibuprofen 600 MG tablet   Commonly known as: ADVIL,MOTRIN   Take 1 tablet (600 mg total) by mouth every 6 (six) hours as needed (mild pain).      mulitivitamin with minerals Tabs   Take 1 tablet by mouth every morning.      olmesartan 40 MG tablet   Commonly known as: BENICAR   Take 40 mg by mouth daily.      oxyCODONE-acetaminophen 5-325 MG per tablet   Commonly known as: PERCOCET   Take 1-2 tablets by mouth every 3 (three) hours as needed (moderate to severe pain (when tolerating fluids)).      vitamin B-12 1000 MCG tablet   Commonly known as: CYANOCOBALAMIN   Take 1,000 mcg by  mouth every morning.           Follow-up Information    Follow up with Garnett Farm, MD on 04/29/2012. (at 3:00)    Contact information:   [address] [place] [address] [phone]          Signed: Oliver Pila 04/22/2012, 7:31 PM

## 2012-04-22 NOTE — Addendum Note (Signed)
Addendum  created 04/22/12 1646 by Suella Grove, CRNA   Modules edited:Notes Section

## 2012-04-22 NOTE — Brief Op Note (Signed)
04/22/2012  9:07 AM  PATIENT:  [name]  41 y.o. female  PRE-OPERATIVE DIAGNOSIS:  Fibroids [218.9] Menorrhagia [626.2] SUI (stress urinary incontinence, female) [625.6]  POST-OPERATIVE DIAGNOSIS:  Fibroids [218.9] Menorrhagia [626.2] SUI (stress urinary incontinence, female) [625.6]  PROCEDURE:  Procedure(s) (LRB): LAPAROSCOPIC SUPRACERVICAL HYSTERECTOMY (N/A) PUBO-VAGINAL SLING (N/A)  SURGEON:  Surgeon(s) and Role: Panel 1:    * Lavina Hamman, MD - Assisting    * Oliver Pila, MD - Primary  Panel 2:    * Garnett Farm, MD - Primary  PHYSICIAN ASSISTANT:    ANESTHESIA:   general  EBL:  Total I/O In: 1000 [I.V.:1000] Out: -   BLOOD ADMINISTERED:none  DRAINS: none   LOCAL MEDICATIONS USED:  LIDOCAINE   SPECIMEN:  Morcellated uterus  DISPOSITION OF SPECIMEN:  PATHOLOGY  COUNTS:  YES  DICTATION: .Dragon Dictation  PLAN OF CARE: Admit for overnight observation  PATIENT DISPOSITION:  PACU - hemodynamically stable.

## 2012-04-22 NOTE — Anesthesia Preprocedure Evaluation (Addendum)
Anesthesia Evaluation  Patient identified by MRN, date of birth, ID band Patient awake    Reviewed: Allergy & Precautions, H&P , Patient's Chart, lab work & pertinent test results, reviewed documented beta blocker date and time   Airway Mallampati: II TM Distance: <3 FB Neck ROM: full    Dental No notable dental hx.    Pulmonary  breath sounds clear to auscultation  Pulmonary exam normal       Cardiovascular hypertension, Pt. on medications Rhythm:regular Rate:Normal     Neuro/Psych    GI/Hepatic GERD-  Medicated and Controlled,  Endo/Other    Renal/GU      Musculoskeletal   Abdominal   Peds  Hematology   Anesthesia Other Findings   Reproductive/Obstetrics                          Anesthesia Physical Anesthesia Plan  ASA: II  Anesthesia Plan: General   Post-op Pain Management:    Induction: Intravenous  Airway Management Planned: Oral ETT  Additional Equipment:   Intra-op Plan:   Post-operative Plan:   Informed Consent: I have reviewed the patients History and Physical, chart, labs and discussed the procedure including the risks, benefits and alternatives for the proposed anesthesia with the patient or authorized representative who has indicated his/her understanding and acceptance.   Dental Advisory Given  Plan Discussed with: CRNA and Surgeon  Anesthesia Plan Comments: (  Discussed  general anesthesia, including possible nausea, instrumentation of airway, sore throat,pulmonary aspiration, etc. I asked if the were any outstanding questions, or  concerns before we proceeded. )       Anesthesia Quick Evaluation

## 2012-04-22 NOTE — Anesthesia Procedure Notes (Signed)
Procedure Name: Intubation Date/Time: 04/22/2012 7:40 AM Performed by: Gertie Fey Pre-anesthesia Checklist: Patient identified, Emergency Drugs available, Suction available, Timeout performed and Patient being monitored Patient Re-evaluated:Patient Re-evaluated prior to inductionOxygen Delivery Method: Circle system utilized Preoxygenation: Pre-oxygenation with 100% oxygen Intubation Type: IV induction Ventilation: Mask ventilation without difficulty Laryngoscope Size: Mac and 3 Grade View: Grade IV Tube size: 7.0 mm Number of attempts: 4 Airway Equipment and Method: Video-laryngoscopy and Stylet Placement Confirmation: ETT inserted through vocal cords under direct vision,  positive ETCO2 and breath sounds checked- equal and bilateral Secured at: 21 cm Tube secured with: Tape Dental Injury: Teeth and Oropharynx as per pre-operative assessment  Difficulty Due To: Difficulty was unanticipated and Difficult Airway- due to anterior larynx Future Recommendations: Recommend- induction with short-acting agent, and alternative techniques readily available

## 2012-04-22 NOTE — Discharge Instructions (Signed)
Post bladder suspension instructions (sling)  Catheter care:  You may or may not go home with a catheter or tube in your bladder. If you or urinating normally, you will probably not need a tube. If you're not emptying normally, some form of drainage is needed. The options include a catheter from the abdomen (called SP tube), a catheter from the urethra, or a self-catheterization routine at timed intervals. These will be discussed with you before your discharge. The type depends on your individual case and preferences. Ask us if you have any questions about the catheter management.  Diet:  You may return to your normal diet within 24 hours following your surgery. You may note some mild nausea and possibly vomiting the first 6-8 hours following surgery. This is usually due to the side effects of anesthesia, and will disappear quite soon. I would suggest clear liquids and a very light meal the first evening following your surgery.  Activity:  Your physical activity is to be restricted, especially during the first 2 weeks home. During this time use the following guidelines:   No lifting heavy objects (anything greater than 10 pounds).  No driving a car and limit long car rides.  No strenuous exercise, limits stairclimbing to a minimum.  Bowels:  You may need a stool softener and. A bowel movement every other day is reasonable. Use a mild laxative if needed, such as milk of magnesia 2-3 tablespoons, or 2 Dulcolax tablets. Call if you continue to have problems. If you had been taking narcotics for pain, before, during or after your surgery, you may be constipated. Take a laxative if necessary.  Medication:  You should resume your pre-surgery medications unless told not to. In addition you may be given an antibiotic to prevent or treat infection. Antibiotics are not always necessary. Pain pills (Tylox or Vicodin) may also be given to help with the incision and catheter discomfort. Tylenol  (acetaminophen) or Advil (ibuprofen) which have no narcotics Arbetter if the pain is not too bad. All medication should be taken as prescribed until the bottles are finished unless you are having an unusual reaction to one of the drugs.  Problems you should report to us:  a. Fever greater than 101F. b. Heavy bleeding, or clots (see notes above about blood in urine). c. Inability to urinate. d. Drug reactions (hives, rash, nausea, vomiting, diarrhea). e. Severe burning or pain with urination that is not improving.   

## 2012-04-22 NOTE — OR Nursing (Signed)
0827 Interceed barrier placed intraabdominally in OR suite per Dr. Senaida Ores. Lot #[id number]. Exp. date10-01-17.

## 2012-04-22 NOTE — Anesthesia Postprocedure Evaluation (Signed)
  Anesthesia Post-op Note  Patient: [name]  Procedure(s) Performed: Procedure(s) (LRB): LAPAROSCOPIC SUPRACERVICAL HYSTERECTOMY (N/A) PUBO-VAGINAL SLING (N/A) CYSTOSCOPY (N/A)  Patient Location: [facility]  Anesthesia Type: General  Level of Consciousness: awake  Airway and Oxygen Therapy: Patient Spontanous Breathing  Post-op Pain: mild  Post-op Assessment: Patient's Cardiovascular Status Stable, Respiratory Function Stable, Patent Airway, No signs of Nausea or vomiting, Adequate PO intake, Pain level controlled and No headache  Post-op Vital Signs: Reviewed and stable  Complications: No apparent anesthesia complications

## 2012-04-22 NOTE — Op Note (Signed)
Operative note  Preoperative diagnosis Menorrhagia Dysfunctional uterine bleeding Dysmenorrhea Stress urinary incontinence  Postoperative diagnosis Same  Procedure Laparoscopic supracervical hysterectomy SPARC suburethral sling placement by Dr. Vernie Ammons  Surgeon Dr. Huel Cote Dr. Tawanna Cooler Meisinger Dr. Ihor Gully  Anesthesia Gen.  Fluids Estimated blood loss 100 cc IV fluids 2200 cc LR Urine output approximately 100 cc clear urine prior to [facility] sling procedure  Findings The ovaries were normal bilaterally there was a previous tubal ligation performed. The uterus was upper limits of normal in size and boggy in consistency. 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 She had general anesthesia which did require the glide scope to visualize her vocal cords but otherwise was without incident. An appropriate time out was performed and she was prepped and draped in the normal sterile fashion in the dorsal lithotomy position. With a Foley catheter in place, a small infraumbilical incision was made with the scalpel after injection with quarter percent Marcaine. The varies needle was then introduced into the abdomen and intraperitoneal placement by aspiration injection with normal saline. The gas flow was then applied and pneumoperitoneum obtained with approximately 2 L of CO2 gas. The varies needle was then removed and the 5 mm Optiview trocar was introduced under direct visualization. The abdomen and pelvis were inspected with findings as previously stated. 2 additional ports were then placed a 5 mm in the upper right quadrant and a 12 mm in the upper left quadrant under direct visualization. Each port site was injected with quarter percent Marcaine prior placement. With instruments in place the uterus was reflected with a single-tooth tenaculum medially and  the right utero-ovarian ligament and broad ligament taken down with harmonic scalpel down to the level of the bladder flap. The bladder flap was then developed with harmonic scalpel and pushed away from the surgical field the uterine artery on the right was then ligated with the harmonic scalpel. Attention was then turned to the left side where a similar fashion the utero-ovarian ligament was taken down as well as the broad ligament and round ligament. The bladder flap was further developed on this side and pushed away from the surgical field. The uterine artery was likewise ligated with harmonic scalpel. At this point there is no active bleeding and the cervix was transected with the harmonic scalpel and the uterine fundus amputated from the cervical stump. The 12 mm trocar was then replaced with the morcellator. The uterus was then morcellated and removed in its entirety. The cul-de-sac and abdomen were inspected with no further fragments noted. The pedicles and cervical stump were copiously irrigated and no active bleeding noted that even with the pressure taken down to a minimum. Interceed was then introduced and placed over the cervical stump. The 12 mm trocar was then removed and the fascia closed with a 0 Vicryl suture under direct visualization. The remainder of the other trochars were removed and the pneumoperitoneum reduced. The skin was then closed with 4-0 Vicryl in a subcuticular stitch at each site and Dermabond. All instruments and sponge counts were correct. The patient was left in the lithotomy position and Dr. Salena Saner arrived to perform the [facility] sling procedure which will be dictated separately.

## 2012-04-22 NOTE — Progress Notes (Signed)
Patient ID: [name], female   DOB: [birth year], 41 y.o.   MRN: [id number] Pt has had uneventful day postoperatively.  Tolerating po meds and diet, voided 100cc x 2 now Pt requesting d/c home.  Will d/c home with Motrin and percocet

## 2012-04-22 NOTE — Progress Notes (Signed)
Patient ID: [name], female   DOB: Mar 28, 1971, 41 y.o.   MRN: [id number] Per pt no changes in dictated H&P, brief exam WNL.

## 2012-04-22 NOTE — Anesthesia Postprocedure Evaluation (Signed)
Anesthesia Post Note  Patient: [name]  Procedure(s) Performed: Procedure(s) (LRB): LAPAROSCOPIC SUPRACERVICAL HYSTERECTOMY (N/A) PUBO-VAGINAL SLING (N/A) CYSTOSCOPY (N/A)  Anesthesia type: General  Patient location: PACU  Post pain: Pain level controlled  Post assessment: Post-op Vital signs reviewed  Last Vitals:  Filed Vitals:   04/22/12 1030  BP: 113/60  Pulse: 84  Temp:   Resp:     Post vital signs: Reviewed  Level of consciousness: sedated  Complications: No apparent anesthesia complicationsfj

## 2012-04-23 NOTE — Plan of Care (Signed)
Problem: Phase I Progression Outcomes Goal: Pain controlled with appropriate interventions Outcome: Completed/Met Date Met:  04/23/12 Good pain control with Percocet Goal: Dangle/OOB as tolerated per MD order Outcome: Completed/Met Date Met:  04/23/12 Patient tolerated up and in Hallway very well Goal: I & O every 4 hrs or as ordered Outcome: Completed/Met Date Met:  04/23/12 Has just voided 300cc clear yellow urine.States she does feel some hesitancy.Told patient to call MD if this continues.  Problem: Phase II Progression Outcomes Goal: Pain controlled on oral analgesia Outcome: Completed/Met Date Met:  04/23/12 Medication side effects discussed Goal: Incision/dressings dry and intact Outcome: Completed/Met Date Met:  04/23/12 Incision care dicussed with patient and she could repeat it back to me. Goal: Other Phase II Outcomes/Goals Outcome: Completed/Met Date Met:  04/23/12 Discussed home care and patient could repeat it back to me as well as her mother.

## 2012-04-23 NOTE — Progress Notes (Signed)
IV D/C'd  Site CDI

## 2012-04-23 NOTE — Plan of Care (Signed)
Problem: Discharge Progression Outcomes Goal: Complications resolved/controlled Outcome: Completed/Met Date Met:  04/23/12 Has voided two more times larger amts Goal: Tolerating diet Outcome: Completed/Met Date Met:  04/23/12 Tolerated regular diet well Goal: Discontinue staples (if applicable) Outcome: Not Applicable Date Met:  04/23/12 Skin glue Goal: Other Discharge Outcomes/Goals Outcome: Completed/Met Date Met:  04/23/12 Understands d/c instructions as she was able to repeat them back to me.

## 2012-04-23 NOTE — Progress Notes (Signed)
Patient discharge teaching done along with family and what to call the MD for.Use and side effects of medications discussed.Patient as well as her mother were able to verbalize back to me all that we discussed.Patient taken to her car via w/c with family members to home.

## 2012-04-27 ENCOUNTER — Encounter (HOSPITAL_COMMUNITY): Payer: Self-pay | Admitting: Obstetrics and Gynecology

## 2013-01-19 ENCOUNTER — Other Ambulatory Visit: Payer: Self-pay | Admitting: Obstetrics and Gynecology

## 2013-01-19 DIAGNOSIS — Z1231 Encounter for screening mammogram for malignant neoplasm of breast: Secondary | ICD-10-CM

## 2013-01-27 ENCOUNTER — Ambulatory Visit: Payer: PRIVATE HEALTH INSURANCE

## 2014-04-22 ENCOUNTER — Ambulatory Visit (INDEPENDENT_AMBULATORY_CARE_PROVIDER_SITE_OTHER): Payer: No Typology Code available for payment source | Admitting: Emergency Medicine

## 2014-04-22 ENCOUNTER — Ambulatory Visit: Payer: No Typology Code available for payment source

## 2014-04-22 VITALS — BP 158/90 | HR 76 | Temp 98.0°F | Resp 16 | Ht 59.0 in | Wt 148.0 lb

## 2014-04-22 DIAGNOSIS — M2390 Unspecified internal derangement of unspecified knee: Secondary | ICD-10-CM

## 2014-04-22 MED ORDER — NAPROXEN SODIUM 550 MG PO TABS: 550.0000 mg | ORAL_TABLET | Freq: Two times a day (BID) | ORAL | Status: AC

## 2014-04-22 NOTE — Patient Instructions (Signed)
Knee Sprain  A knee sprain is a tear in one of the strong, fibrous tissues that connect the bones (ligaments) in your knee. The severity of the sprain depends on how much of the ligament is torn. The tear can be either partial or complete.  CAUSES   Often, sprains are a result of a fall or injury. The force of the impact causes the fibers of your ligament to stretch too much. This excess tension causes the fibers of your ligament to tear.  SIGNS AND SYMPTOMS   You may have some loss of motion in your knee. Other symptoms include:   Bruising.   Pain in the knee area.   Tenderness of the knee to the touch.   Swelling.  DIAGNOSIS   To diagnose a knee sprain, your health care provider will physically examine your knee. Your health care provider may also suggest an X-ray exam of your knee to make sure no bones are broken.  TREATMENT   If your ligament is only partially torn, treatment usually involves keeping the knee in a fixed position (immobilization) or bracing your knee for activities that require movement for several weeks. To do this, your health care provider will apply a bandage, cast, or splint to keep your knee from moving and to support your knee during movement until it heals. For a partially torn ligament, the healing process usually takes 4 6 weeks.  If your ligament is completely torn, depending on which ligament it is, you may need surgery to reconnect the ligament to the bone or reconstruct it. After surgery, a cast or splint may be applied and will need to stay on your knee for 4 6 weeks while your ligament heals.  HOME CARE INSTRUCTIONS   Keep your injured knee elevated to decrease swelling.   To ease pain and swelling, apply ice to the injured area:   Put ice in a plastic bag.   Place a towel between your skin and the bag.   Leave the ice on for 20 minutes, 2 3 times a day.   Only take medicine for pain as directed by your health care provider.   Do not leave your knee unprotected until  pain and stiffness go away (usually 4 6 weeks).   If you have a cast or splint, do not allow it to get wet. If you have been instructed not to remove it, cover it with a plastic bag when you shower or bathe. Do not swim.   Your health care provider may suggest exercises for you to do during your recovery to prevent or limit permanent weakness and stiffness.  SEEK IMMEDIATE MEDICAL CARE IF:   Your cast or splint becomes damaged.   Your pain becomes worse.   You have significant pain, swelling, or numbness below the cast or splint.  MAKE SURE YOU:   Understand these instructions.   Will watch your condition.   Will get help right away if you are not doing well or get worse.  Document Released: 11/16/2005 Document Revised: 09/06/2013 Document Reviewed: 06/28/2013  [employer] Patient Information 2014 [employer].
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
## 2014-12-17 IMAGING — CR DG KNEE COMPLETE 4+V*R*
5 series · 5 of 5 positions shown · non-contrast
Comparison: None.

CLINICAL DATA: Right knee pain.

EXAM:
RIGHT KNEE - COMPLETE 4+ VIEW

[AP]
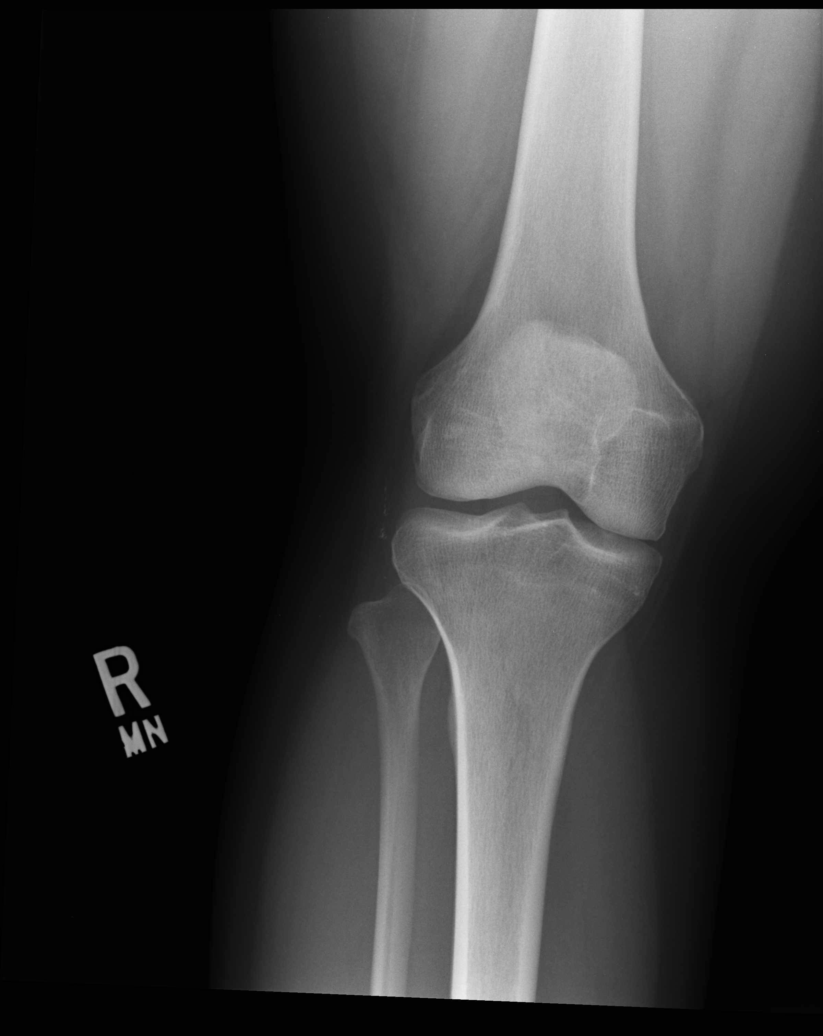

[lateral]
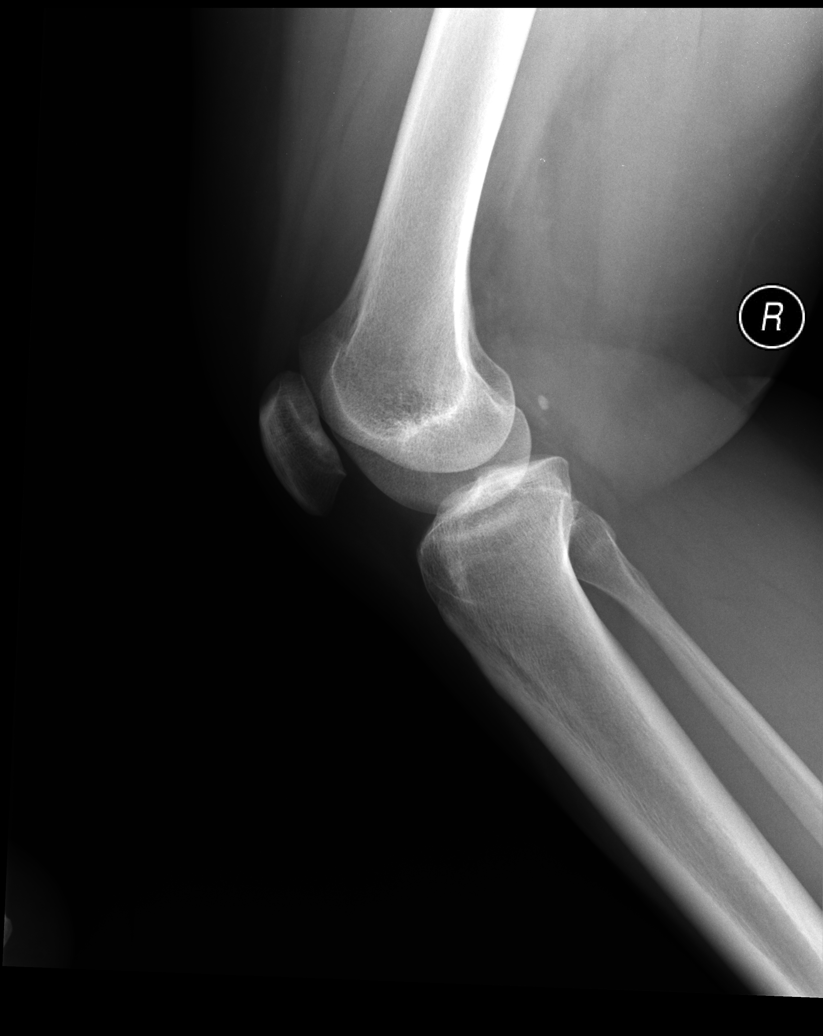

[ap ext rot]
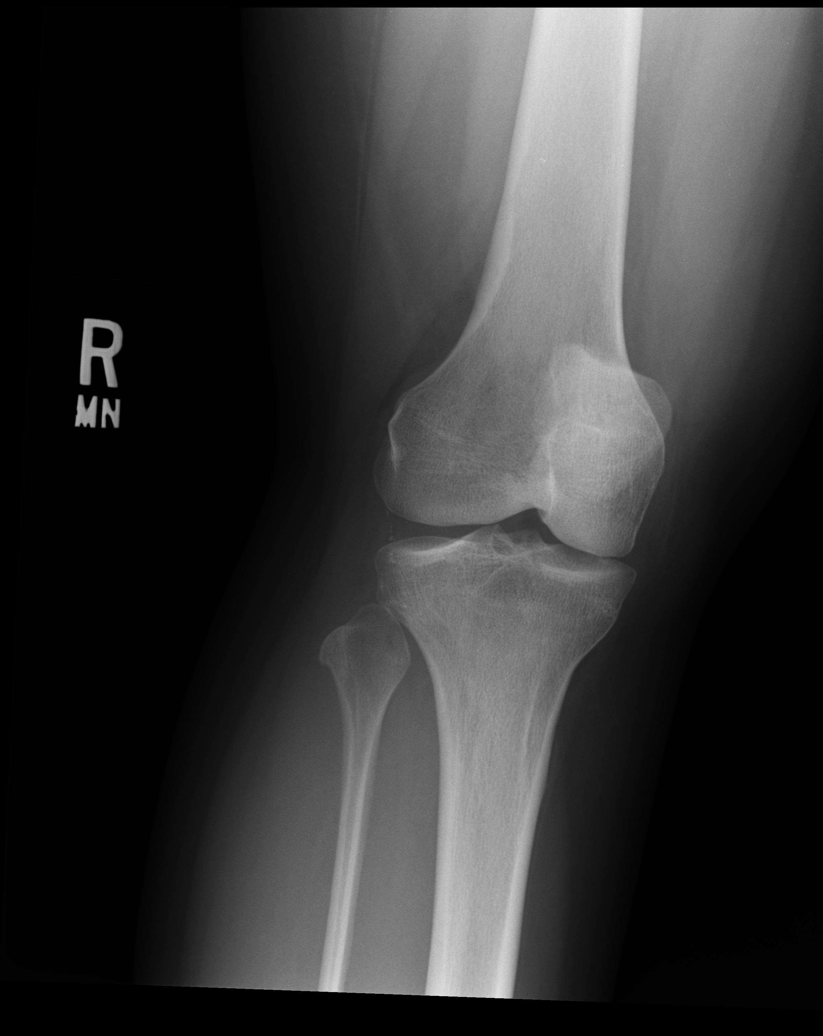

[ap int rot]
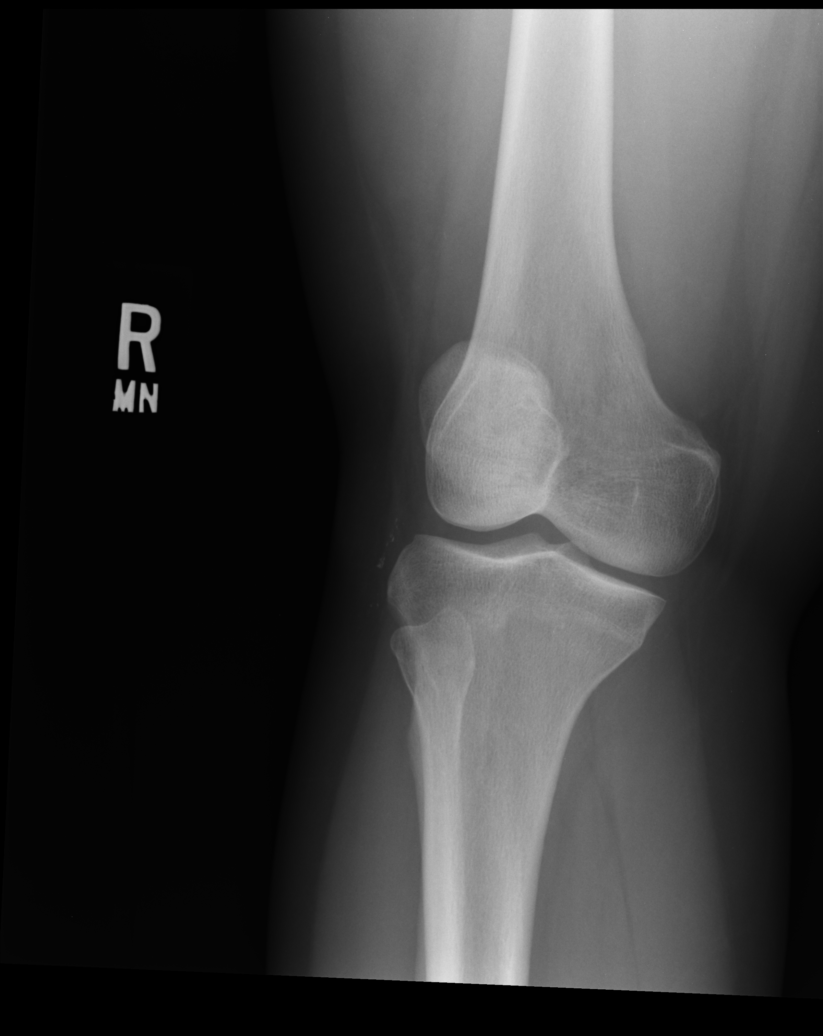

[sunrise]
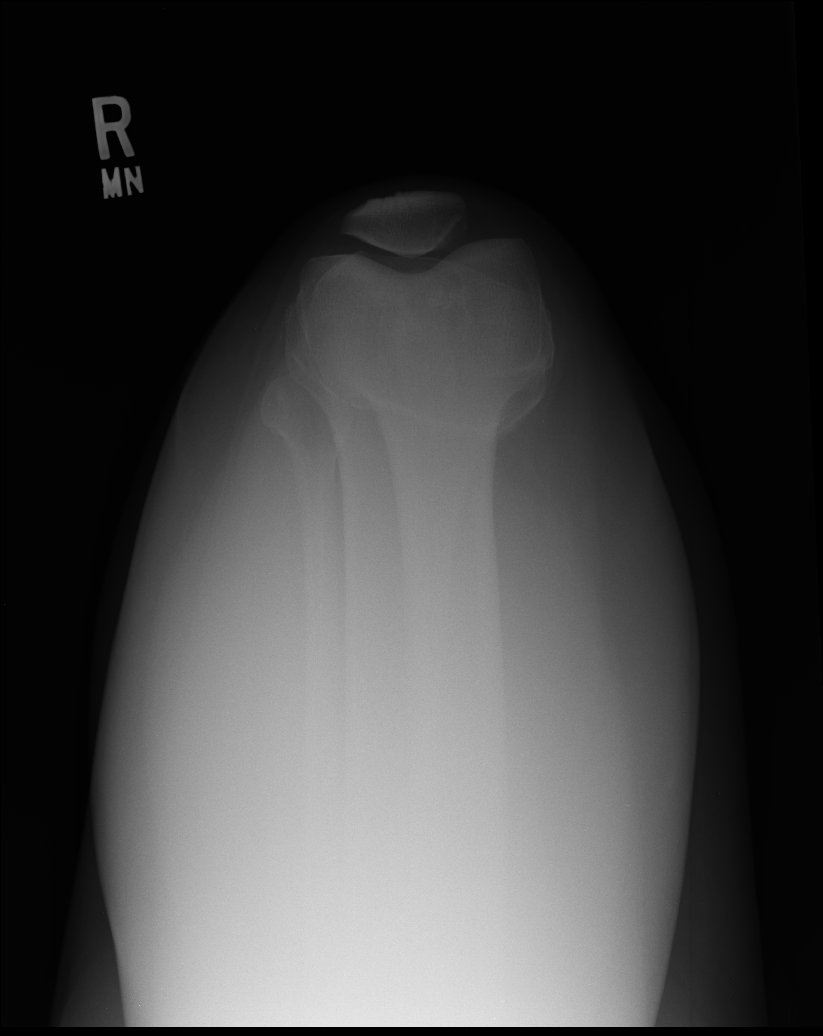

[5 of 5 positions shown; findings below may reference images not displayed]

FINDINGS: Normal anatomic alignment. No evidence for acute fracture or
dislocation. Nonspecific soft tissue calcification along the lateral
margin of the knee joint. No joint effusion.
IMPRESSION: No evidence for acute fracture or dislocation.

## 2016-04-17 ENCOUNTER — Other Ambulatory Visit: Payer: Self-pay | Admitting: Obstetrics and Gynecology

## 2016-04-21 LAB — CYTOLOGY - PAP

## 2016-05-13 ENCOUNTER — Telehealth: Payer: Self-pay | Admitting: *Deleted

## 2016-05-13 NOTE — Telephone Encounter (Signed)
Unable to reach patient at time of Pre-Visit Call. Mobile number is invalid. No message left on home number as voicemail was identified as belonging to "Wayne."

## 2016-05-14 ENCOUNTER — Encounter: Payer: Self-pay | Admitting: Family Medicine

## 2016-05-14 ENCOUNTER — Ambulatory Visit (INDEPENDENT_AMBULATORY_CARE_PROVIDER_SITE_OTHER): Payer: BLUE CROSS/BLUE SHIELD | Admitting: Family Medicine

## 2016-05-14 VITALS — BP 152/100 | HR 88 | Temp 98.3°F | Ht 59.0 in | Wt 159.4 lb

## 2016-05-14 DIAGNOSIS — Z130 Encounter for screening for diseases of the blood and blood-forming organs and certain disorders involving the immune mechanism: Secondary | ICD-10-CM | POA: Diagnosis not present

## 2016-05-14 DIAGNOSIS — Z13220 Encounter for screening for lipoid disorders: Secondary | ICD-10-CM | POA: Diagnosis not present

## 2016-05-14 DIAGNOSIS — I10 Essential (primary) hypertension: Secondary | ICD-10-CM | POA: Diagnosis not present

## 2016-05-14 DIAGNOSIS — Z131 Encounter for screening for diabetes mellitus: Secondary | ICD-10-CM

## 2016-05-14 DIAGNOSIS — Z1329 Encounter for screening for other suspected endocrine disorder: Secondary | ICD-10-CM

## 2016-05-14 DIAGNOSIS — K5090 Crohn's disease, unspecified, without complications: Secondary | ICD-10-CM

## 2016-05-14 DIAGNOSIS — N926 Irregular menstruation, unspecified: Secondary | ICD-10-CM

## 2016-05-14 DIAGNOSIS — F4323 Adjustment disorder with mixed anxiety and depressed mood: Secondary | ICD-10-CM

## 2016-05-14 LAB — CBC
HEMATOCRIT: 39.1 % (ref 36.0–46.0)
HEMOGLOBIN: 13 g/dL (ref 12.0–15.0)
MCHC: 33.2 g/dL (ref 30.0–36.0)
MCV: 89.4 fl (ref 78.0–100.0)
Platelets: 420 10*3/uL — ABNORMAL HIGH (ref 150.0–400.0)
RBC: 4.37 Mil/uL (ref 3.87–5.11)
RDW: 13.2 % (ref 11.5–15.5)
WBC: 8 10*3/uL (ref 4.0–10.5)

## 2016-05-14 LAB — COMPREHENSIVE METABOLIC PANEL
ALBUMIN: 4.4 g/dL (ref 3.5–5.2)
ALK PHOS: 70 U/L (ref 39–117)
ALT: 21 U/L (ref 0–35)
AST: 25 U/L (ref 0–37)
BUN: 11 mg/dL (ref 6–23)
CO2: 27 mEq/L (ref 19–32)
Calcium: 9.6 mg/dL (ref 8.4–10.5)
Chloride: 101 mEq/L (ref 96–112)
Creatinine, Ser: 0.84 mg/dL (ref 0.40–1.20)
GFR: 93.69 mL/min (ref >60.00)
Glucose, Bld: 94 mg/dL (ref 70–99)
POTASSIUM: 3.5 meq/L (ref 3.5–5.1)
SODIUM: 137 meq/L (ref 135–145)
TOTAL PROTEIN: 8.4 g/dL — AB (ref 6.0–8.3)
Total Bilirubin: 0.6 mg/dL (ref 0.2–1.2)

## 2016-05-14 LAB — LIPID PANEL
CHOLESTEROL: 220 mg/dL — AB (ref 0–200)
HDL: 51.4 mg/dL (ref >39.00)
LDL Cholesterol: 139 mg/dL — ABNORMAL HIGH (ref 0–99)
NonHDL: 168.62
Total CHOL/HDL Ratio: 4
Triglycerides: 150 mg/dL — ABNORMAL HIGH (ref 0.0–149.0)
VLDL: 30 mg/dL (ref 0.0–40.0)

## 2016-05-14 LAB — FOLLICLE STIMULATING HORMONE: FSH: 13.1 m[IU]/mL

## 2016-05-14 LAB — TSH: TSH: 1.54 u[IU]/mL (ref 0.35–4.50)

## 2016-05-14 LAB — HEMOGLOBIN A1C: Hgb A1c MFr Bld: 5.5 % (ref 4.6–6.5)

## 2016-05-14 MED ORDER — FLUOXETINE HCL 20 MG PO TABS
20.0000 mg | ORAL_TABLET | Freq: Every day | ORAL | Status: DC
Start: 2016-05-14 — End: 2016-05-23

## 2016-05-14 MED ORDER — HYDROCHLOROTHIAZIDE 25 MG PO TABS: ORAL_TABLET | ORAL | Status: DC

## 2016-05-14 MED FILL — FLUoxetine HCL 20 MG CAPS: 20 | 30 days supply | Qty: 30 | Fill #0

## 2016-05-14 MED FILL — HYDROCHLOROTHIAZIDE 25 MG T: 25 | 60 days supply | Qty: 60 | Fill #0

## 2016-05-14 NOTE — Patient Instructions (Addendum)
It was very nice to meet you today!  We will check labs and I will be in touch with the results asap For your blood pressure, please start on a 1/2 tablet of the hydrochlorothiazide daily.  If your home BP readings are still running (on average) higher than 135/90 increase to a whole tablet   Please come and see me in 3-4 months to follow-up labs and make sure that you are tolerating the medication well  We will also have you start on fluoxetine 20 mg for your mood symptoms.  Please let me know how this is working for you in about one month.  My hope is that you will gradually notice that you feel less sad and have more even moods.   Let me know if any other concerns in this regard!

## 2016-05-14 NOTE — Progress Notes (Signed)
Pre visit review using our clinic review tool, if applicable. No additional management support is needed unless otherwise documented below in the visit note. 

## 2016-05-14 NOTE — Progress Notes (Signed)
[facility] [address] [place], [place] [address] [phone]  Date:  05/14/2016   Name:  [name]   DOB:  10-31-1971   MRN:  [id number]  PCP:  Abbe Amsterdam, MD    Chief Complaint: Establish Care   History of Present Illness:  [name] is a 45 y.o. very pleasant female patient who presents with the following:  Here today as a new patient to establish care and discuss a couple of concerns.  She has a long history of HTN since age 89. She was treated in the past but it has been a long time since she took medication. Most recently took HCTZ and did well with this.   She does have a family history of same.  She had a partial hysterectomy in 2013- per op note it appears that she still has her cervix and ovaries; per pt she has part of her uterus as they did not want to cut through a portion that had endometriosis.   She still has vaginal bleeding off and on- her OB is aware of this .  History of HTN, crohn's disease, GERD  She does not see GI on a regular basis.  States that her crohns is quiet and not bothersome Married, she has 2 children- 109 and nearly 65 years old, they are dong well She is currently studying for her ?masters degree and is not working  She did not have labs done at her OB visit.    Would like to do so today  Her husband notes that she seems to have a lot of mood swings and to be down a lot.  Pt agrees that she suspect she is a bit depressed.  Denies any SI or other serious sx- she is sleeping and eating well- but cries easily and does not have as much energy or motivation as she did in the past. She has noted this for several months. Would like to try medication for depression Her own mother did have menopause early. She would also like to be tested for menopause  She is fasting today.    Tetanus shot- we think 2014  EKG 2013- normal prior to operation  There are no active problems to  display for this patient.   Past Medical History  Diagnosis Date  . Hypertension   . Blood transfusion [address], [place]  . Shortness of breath   . H/O hiatal hernia   . Crohn's disease (HCC)   . GERD (gastroesophageal reflux disease)     No meds    Past Surgical History  Procedure Laterality Date  . Vag del x2    . Laparoscopic supracervical hysterectomy  04/22/2012    Procedure: LAPAROSCOPIC SUPRACERVICAL HYSTERECTOMY;  Surgeon: Oliver Pila, MD;  Location: [facility];  Service: Gynecology;  Laterality: N/A;  . Pubovaginal sling  04/22/2012    Procedure: Leonides Grills;  Surgeon: Garnett Farm, MD;  Location: [facility];  Service: Urology;  Laterality: N/A;  superpubic sling  . Cystoscopy  04/22/2012    Procedure: CYSTOSCOPY;  Surgeon: Garnett Farm, MD;  Location: [facility];  Service: Urology;  Laterality: N/A;    Social History  Substance Use Topics  . Smoking status: Never Smoker   . Smokeless tobacco: None  . Alcohol Use: 0.6 - 1.2 oz/week    1-2 Standard drinks or equivalent per week  Comment: occasional wine    Family History  Problem Relation Age of Onset  . Hypertension Mother   . Hypertension Father   . Diabetes Father   . Alzheimer's disease Father   . Throat cancer Father     No Known Allergies  Medication list has been reviewed and updated.  No current outpatient prescriptions on file prior to visit.   No current facility-administered medications on file prior to visit.    Review of Systems:  As per HPI- otherwise negative.   Physical Examination: Filed Vitals:   05/14/16 0832  BP: 148/98  Pulse: 88  Temp: 98.3 F (36.8 C)   Filed Vitals:   05/14/16 0832  Height:  (1.499 m)  Weight: 159 lb 6.4 oz (72.303 kg)   Body mass index is 32.18 kg/(m^2). Ideal Body Weight: Weight in (lb) to have BMI = 25: 123.5  GEN: WDWN, NAD, Non-toxic, A & O x 3, overweight, looks well HEENT: Atraumatic, Normocephalic. Neck  supple. No masses, No LAD.  Bilateral TM wnl, oropharynx normal.  PEERL,EOMI.   Ears and Nose: No external deformity. CV: RRR, No M/G/R. No JVD. No thrill. No extra heart sounds. PULM: CTA B, no wheezes, crackles, rhonchi. No retractions. No resp. distress. No accessory muscle use. ABD: S, NT, ND EXTR: No c/c/e NEURO Normal gait.  PSYCH: Normally interactive. Conversant. Not depressed or anxious appearing.  Calm demeanor.   Assessment and Plan: Essential hypertension - Plan: hydrochlorothiazide (HYDRODIURIL) 25 MG tablet  Screening for deficiency anemia - Plan: CBC  Screening for thyroid disorder - Plan: TSH  Screening for hyperlipidemia - Plan: Lipid panel  Screening for diabetes mellitus - Plan: Comprehensive metabolic panel, Hemoglobin A1c  Irregular menstrual bleeding - Plan: FSH  Crohn's disease without complication, unspecified gastrointestinal tract location (HCC)  Adjustment disorder with mixed anxiety and depressed mood - Plan: FLUoxetine (PROZAC) 20 MG tablet  Restart HCTZ for HTN Labs pending as above Mild depression- will start on prozac 20  It was very nice to meet you today!  We will check labs and I will be in touch with the results asap For your blood pressure, please start on a 1/2 tablet of the hydrochlorothiazide daily.  If your home BP readings are still running (on average) higher than 135/90 increase to a whole tablet   Please come and see me in 3-4 months to follow-up labs and make sure that you are tolerating the medication well  We will also have you start on fluoxetine 20 mg for your mood symptoms.  Please let me know how this is working for you in about one month.  My hope is that you will gradually notice that you feel less sad and have more even moods.   Let me know if any other concerns in this regard!   Signed Abbe Amsterdam, MD

## 2016-05-22 ENCOUNTER — Encounter: Payer: Self-pay | Admitting: Family Medicine

## 2016-05-23 MED ORDER — BUPROPION HCL ER (SR) 150 MG PO TB12: 150.0000 mg | ORAL_TABLET | Freq: Two times a day (BID) | ORAL | Status: DC

## 2016-05-23 NOTE — Addendum Note (Signed)
Addended by: Abbe Amsterdam C on: 05/23/2016 05:55 PM   Modules accepted: Orders, Medications

## 2016-05-25 MED FILL — BUPROPION HCL SR 150 MG TAB: 150 | 30 days supply | Qty: 60 | Fill #0

## 2016-05-30 ENCOUNTER — Encounter: Payer: Self-pay | Admitting: Family Medicine

## 2016-05-31 ENCOUNTER — Other Ambulatory Visit: Payer: Self-pay | Admitting: Family Medicine

## 2016-06-01 NOTE — Telephone Encounter (Signed)
Please check with patient if she still wants to cut off the Wellbutrin because she is not doing well have her drop to 1 tab daily x 1 week then 1 tab qod x 1 week then stop and can restart Fluoxetine 20 mg tabs, 1 tab daily if she needs some can disp #30. If nausea is her biggest concern she could try taking 1/2 tab bid to see if that helps. Then have her come in in next month or so to discuss response with Dr Dallas Schimke     ----- Message -----     From: [name]     Sent: 05/30/2016  8:52 AM      To: [facility] Clinical Pool    Subject: Non-Urgent Medical Question                 Dr. Patsy Lager,    Its been 1 week on the wellbutrin. I do not feel myself I am very easily to get irritated with the kids. And my mouth taste bittery all the time. I don't want to be like that. And I was wanting to know if I can gradually get off of this and go back to prozac. I know I got nausa but I felt so much better with emotions toward my family and they like me too. I will deal with nausa by eating while taking the pills and drink ginger ale. I want to give prozac one more try if I may. Let me know if that will be okay. Thank you again for help Dr.Copland.        Thanks,    [name]    Pt stated she did want to stop Wellbutrin. Providers instructions discussed with patient.  She stated understanding and agreed with plan. Follow up appt scheduled with Dr. Patsy Lager on 07/02/16.  She was encouraged to call back with questions or concerns.  Pt was very appreciated.  No additional needs voiced at this time.    Pt is active on MyChart.  Instructions were also sent to her via MyChart just in case.

## 2016-06-22 MED FILL — FLUoxetine HCL 20 MG CAPS: 20 | 30 days supply | Qty: 30 | Fill #1

## 2016-06-28 ENCOUNTER — Encounter (HOSPITAL_BASED_OUTPATIENT_CLINIC_OR_DEPARTMENT_OTHER): Payer: Self-pay | Admitting: *Deleted

## 2016-06-28 ENCOUNTER — Emergency Department (HOSPITAL_BASED_OUTPATIENT_CLINIC_OR_DEPARTMENT_OTHER)
Admission: EM | Admit: 2016-06-28 | Discharge: 2016-06-28 | Disposition: A | Discharge status: Home / Self Care | Payer: BLUE CROSS/BLUE SHIELD | Attending: Emergency Medicine | Admitting: Emergency Medicine

## 2016-06-28 ENCOUNTER — Emergency Department (HOSPITAL_BASED_OUTPATIENT_CLINIC_OR_DEPARTMENT_OTHER): Payer: BLUE CROSS/BLUE SHIELD

## 2016-06-28 DIAGNOSIS — R110 Nausea: Secondary | ICD-10-CM

## 2016-06-28 DIAGNOSIS — K5010 Crohn's disease of large intestine without complications: Secondary | ICD-10-CM | POA: Diagnosis not present

## 2016-06-28 DIAGNOSIS — I10 Essential (primary) hypertension: Secondary | ICD-10-CM | POA: Insufficient documentation

## 2016-06-28 DIAGNOSIS — Z79899 Other long term (current) drug therapy: Secondary | ICD-10-CM | POA: Insufficient documentation

## 2016-06-28 DIAGNOSIS — N200 Calculus of kidney: Secondary | ICD-10-CM

## 2016-06-28 DIAGNOSIS — N390 Urinary tract infection, site not specified: Secondary | ICD-10-CM | POA: Diagnosis not present

## 2016-06-28 DIAGNOSIS — R1031 Right lower quadrant pain: Secondary | ICD-10-CM | POA: Diagnosis present

## 2016-06-28 LAB — URINALYSIS, ROUTINE W REFLEX MICROSCOPIC
BILIRUBIN URINE: NEGATIVE
Glucose, UA: NEGATIVE mg/dL
Ketones, ur: NEGATIVE mg/dL
NITRITE: NEGATIVE
PROTEIN: NEGATIVE mg/dL
SPECIFIC GRAVITY, URINE: 1.021 (ref 1.005–1.030)
pH: 6 (ref 5.0–8.0)

## 2016-06-28 LAB — COMPREHENSIVE METABOLIC PANEL
ALT: 20 U/L (ref 14–54)
AST: 23 U/L (ref 15–41)
Albumin: 4.3 g/dL (ref 3.5–5.0)
Alkaline Phosphatase: 81 U/L (ref 38–126)
Anion gap: 14 (ref 5–15)
BILIRUBIN TOTAL: 0.7 mg/dL (ref 0.3–1.2)
BUN: 23 mg/dL — AB (ref 6–20)
CALCIUM: 9.8 mg/dL (ref 8.9–10.3)
CHLORIDE: 103 mmol/L (ref 101–111)
CO2: 17 mmol/L — ABNORMAL LOW (ref 22–32)
CREATININE: 1.83 mg/dL — AB (ref 0.44–1.00)
GFR, EST AFRICAN AMERICAN: 37 mL/min — AB (ref >60)
GFR, EST NON AFRICAN AMERICAN: 32 mL/min — AB (ref >60)
Glucose, Bld: 93 mg/dL (ref 65–99)
Potassium: 3.8 mmol/L (ref 3.5–5.1)
Sodium: 134 mmol/L — ABNORMAL LOW (ref 135–145)
TOTAL PROTEIN: 8.5 g/dL — AB (ref 6.5–8.1)

## 2016-06-28 LAB — CBC WITH DIFFERENTIAL/PLATELET
BASOS ABS: 0 10*3/uL (ref 0.0–0.1)
BASOS PCT: 0 %
EOS ABS: 0.1 10*3/uL (ref 0.0–0.7)
EOS PCT: 1 %
HCT: 40 % (ref 36.0–46.0)
Hemoglobin: 13.9 g/dL (ref 12.0–15.0)
LYMPHS PCT: 20 %
Lymphs Abs: 2.2 10*3/uL (ref 0.7–4.0)
MCH: 30 pg (ref 26.0–34.0)
MCHC: 34.8 g/dL (ref 30.0–36.0)
MCV: 86.2 fL (ref 78.0–100.0)
MONO ABS: 0.8 10*3/uL (ref 0.1–1.0)
Monocytes Relative: 7 %
Neutro Abs: 7.9 10*3/uL — ABNORMAL HIGH (ref 1.7–7.7)
Neutrophils Relative %: 72 %
PLATELETS: 405 10*3/uL — AB (ref 150–400)
RBC: 4.64 MIL/uL (ref 3.87–5.11)
RDW: 12.4 % (ref 11.5–15.5)
WBC: 10.9 10*3/uL — AB (ref 4.0–10.5)

## 2016-06-28 LAB — URINE MICROSCOPIC-ADD ON

## 2016-06-28 LAB — PREGNANCY, URINE: PREG TEST UR: NEGATIVE

## 2016-06-28 LAB — LIPASE, BLOOD: LIPASE: 39 U/L (ref 11–51)

## 2016-06-28 MED ORDER — HYDROMORPHONE HCL 1 MG/ML IJ SOLN
1.0000 mg | Freq: Once | INTRAMUSCULAR | Status: AC
  Administered 2016-06-28: 1 mg via INTRAVENOUS
  Filled 2016-06-28: qty 1

## 2016-06-28 MED ORDER — MORPHINE SULFATE (PF) 4 MG/ML IV SOLN
4.0000 mg | Freq: Once | INTRAVENOUS | Status: AC
  Administered 2016-06-28: 4 mg via INTRAVENOUS
  Filled 2016-06-28: qty 1

## 2016-06-28 MED ORDER — TAMSULOSIN HCL 0.4 MG PO CAPS: 0.4000 mg | ORAL_CAPSULE | Freq: Every day | ORAL | 0 refills | Status: DC

## 2016-06-28 MED ORDER — ONDANSETRON HCL 4 MG PO TABS: 4.0000 mg | ORAL_TABLET | Freq: Four times a day (QID) | ORAL | 0 refills | Status: DC

## 2016-06-28 MED ORDER — ONDANSETRON HCL 4 MG/2ML IJ SOLN
4.0000 mg | Freq: Once | INTRAMUSCULAR | Status: AC
  Administered 2016-06-28: 4 mg via INTRAVENOUS
  Filled 2016-06-28: qty 2

## 2016-06-28 MED ORDER — SULFAMETHOXAZOLE-TRIMETHOPRIM 800-160 MG PO TABS
1.0000 | ORAL_TABLET | Freq: Two times a day (BID) | ORAL | 0 refills | Status: AC
Start: 2016-06-28 — End: 2016-07-05

## 2016-06-28 MED ORDER — DEXTROSE 5 % IV SOLN
1.0000 g | Freq: Once | INTRAVENOUS | Status: AC
  Administered 2016-06-28: 1 g via INTRAVENOUS
  Filled 2016-06-28: qty 10

## 2016-06-28 MED ORDER — OXYCODONE-ACETAMINOPHEN 5-325 MG PO TABS: 1.0000 | ORAL_TABLET | ORAL | 0 refills | Status: DC | PRN

## 2016-06-28 MED ORDER — SODIUM CHLORIDE 0.9 % IV BOLUS (SEPSIS)
1000.0000 mL | Freq: Once | INTRAVENOUS | Status: AC
  Administered 2016-06-28: 1000 mL via INTRAVENOUS

## 2016-06-28 MED ORDER — IOPAMIDOL (ISOVUE-300) INJECTION 61%
100.0000 mL | Freq: Once | INTRAVENOUS | Status: AC | PRN
  Administered 2016-06-28: 100 mL via INTRAVENOUS

## 2016-06-28 NOTE — ED Notes (Signed)
Pt made aware to return if symptoms worsen or if any life threatening symptoms occur.  Pt made aware not to drive or go to work while taking narcotic pain medication.    

## 2016-06-28 NOTE — ED Provider Notes (Signed)
[facility] Note   CSN: [id number] Arrival date & time: 06/28/16  1517  First Provider Contact:  First MD Initiated Contact with Patient 06/28/16 1733   By signing my name below, I, Bridgette Habermann, attest that this documentation has been prepared under the direction and in the presence of Jacalyn Lefevre, MD. Electronically Signed: Bridgette Habermann, ED Scribe. 06/28/16. 5:50 PM.  History   Chief Complaint Chief Complaint  Patient presents with  . Abdominal Pain    HPI Comments: [name] is a 45 y.o. female with h/o HTN, Crohn's disease, and GERD who presents to the Emergency Department complaining of sudden onset, constant, non-radiating, RLQ abdominal pain onset one week ago. Pt also has associated nausea. She notes that she has tried OTC medicine for GERD and pain which did not relieve her symptoms. No alleviating factors noted. Pt denies vaginal discharge, vomiting, or any other associated symptoms.    The history is provided by the patient. No language interpreter was used.    Past Medical History:  Diagnosis Date  . Blood transfusion [address], [place]  . Crohn's disease (HCC)   . GERD (gastroesophageal reflux disease)    No meds  . H/O hiatal hernia   . Hypertension   . Shortness of breath     Patient Active Problem List   Diagnosis Date Noted  . Essential hypertension 05/14/2016  . Irregular menstrual bleeding 05/14/2016  . Crohn's disease (HCC) 05/14/2016    Past Surgical History:  Procedure Laterality Date  . CYSTOSCOPY  04/22/2012   Procedure: CYSTOSCOPY;  Surgeon: Garnett Farm, MD;  Location: [facility];  Service: Urology;  Laterality: N/A;  . LAPAROSCOPIC SUPRACERVICAL HYSTERECTOMY  04/22/2012   Procedure: LAPAROSCOPIC SUPRACERVICAL HYSTERECTOMY;  Surgeon: Oliver Pila, MD;  Location: [facility];  Service: Gynecology;  Laterality: N/A;  . PUBOVAGINAL SLING  04/22/2012   Procedure: Leonides Grills;  Surgeon: Garnett Farm, MD;   Location: [facility];  Service: Urology;  Laterality: N/A;  superpubic sling  . vag del x2      OB History    No data available       Home Medications    Prior to Admission medications   Medication Sig Start Date End Date Taking? Authorizing Provider  FLUoxetine (PROZAC) 20 MG tablet Take 20 mg by mouth daily.   Yes Historical Provider, MD  hydrochlorothiazide (HYDRODIURIL) 25 MG tablet Start with a 1/2 tablet once daily. Increase to a whole as needed 05/14/16   Pearline Cables, MD  Multiple Vitamin (MULTIVITAMIN) tablet Take 1 tablet by mouth daily.    Historical Provider, MD  ondansetron (ZOFRAN) 4 MG tablet Take 1 tablet (4 mg total) by mouth every 6 (six) hours. 06/28/16   Jacalyn Lefevre, MD  oxyCODONE-acetaminophen (PERCOCET/ROXICET) 5-325 MG tablet Take 1 tablet by mouth every 4 (four) hours as needed for severe pain. 06/28/16   Jacalyn Lefevre, MD  sulfamethoxazole-trimethoprim (BACTRIM DS,SEPTRA DS) 800-160 MG tablet Take 1 tablet by mouth 2 (two) times daily. 06/28/16 07/05/16  Jacalyn Lefevre, MD  tamsulosin (FLOMAX) 0.4 MG CAPS capsule Take 1 capsule (0.4 mg total) by mouth daily. 06/28/16   Jacalyn Lefevre, MD    Family History Family History  Problem Relation Age of Onset  . Hypertension Mother   . Hypertension Father   . Diabetes Father   . Alzheimer's disease Father   . Throat cancer Father     Social History Social History  Substance Use  Topics  . Smoking status: Never Smoker  . Smokeless tobacco: Never Used  . Alcohol use 0.6 - 1.2 oz/week    1 - 2 Standard drinks or equivalent per week     Comment: occasional wine     Allergies   Review of patient's allergies indicates no known allergies.   Review of Systems Review of Systems 10 Systems reviewed and all are negative for acute change except as noted in the HPI. Physical Exam Updated Vital Signs BP 147/96   Pulse 88   Temp 98.4 F (36.9 C) (Oral)   Resp 22   Ht 4\' 11"  (1.499 m)   Wt 151 lb (68.5 kg)    LMP 05/20/2016   SpO2 100%   BMI 30.50 kg/m   Physical Exam  Constitutional: She appears well-developed and well-nourished.  HENT:  Head: Normocephalic.  Eyes: Conjunctivae are normal.  Cardiovascular: Normal rate.   Pulmonary/Chest: Effort normal. No respiratory distress.  Abdominal: Soft. She exhibits no distension. There is tenderness.  RLQ tenderness  Musculoskeletal: Normal range of motion.  Neurological: She is alert.  Skin: Skin is warm and dry.  Psychiatric: She has a normal mood and affect. Her behavior is normal.  Nursing note and vitals reviewed.    ED Treatments / Results  DIAGNOSTIC STUDIES: Oxygen Saturation is 100% on RA, normal by my interpretation.    COORDINATION OF CARE: 5:48 PM Discussed treatment plan with pt at bedside and pt agreed to plan.  Labs (all labs ordered are listed, but only abnormal results are displayed) Labs Reviewed  URINALYSIS, ROUTINE W REFLEX MICROSCOPIC (NOT AT [facility]) - Abnormal; Notable for the following:       Result Value   APPearance CLOUDY (*)    Hgb urine dipstick LARGE (*)    Leukocytes, UA MODERATE (*)    All other components within normal limits  URINE MICROSCOPIC-ADD ON - Abnormal; Notable for the following:    Squamous Epithelial / LPF 0-5 (*)    Bacteria, UA MANY (*)    All other components within normal limits  COMPREHENSIVE METABOLIC PANEL - Abnormal; Notable for the following:    Sodium 134 (*)    CO2 17 (*)    BUN 23 (*)    Creatinine, Ser 1.83 (*)    Total Protein 8.5 (*)    GFR calc non Af Amer 32 (*)    GFR calc Af Amer 37 (*)    All other components within normal limits  CBC WITH DIFFERENTIAL/PLATELET - Abnormal; Notable for the following:    WBC 10.9 (*)    Platelets 405 (*)    Neutro Abs 7.9 (*)    All other components within normal limits  URINE CULTURE  PREGNANCY, URINE  LIPASE, BLOOD    EKG  EKG Interpretation None       Radiology Ct Abdomen Pelvis W Contrast  Result Date:  06/28/2016 CLINICAL DATA:  Right lower quadrant pain for 1 week. Patient with history of Crohn's disease. EXAM: CT ABDOMEN AND PELVIS WITH CONTRAST TECHNIQUE: Multidetector CT imaging of the abdomen and pelvis was performed using the standard protocol following bolus administration of intravenous contrast. CONTRAST:  ISOVUE-300 IOPAMIDOL (ISOVUE-300) INJECTION 61% COMPARISON:  None. FINDINGS: Lower chest: Minimal scarring or atelectasis at the left greater than right dependent lung base. No pleural effusion. Liver: Homogeneous attenuation.  No focal lesion. Hepatobiliary: Gallbladder physiologically distended, no calcified stone. No biliary dilatation. Pancreas: No ductal dilatation or inflammation. Spleen: Normal. Adrenal glands: No nodule.  Kidneys: Right renal enlargement with heterogeneous enhancement and delayed renal excretion. There is moderate right hydronephrosis and proximal hydroureter with urothelial thickening. Probable distal right ureteral stone measures 5 x 7 mm. There is mild right perinephric edema. Normal left renal enhancement. Stomach/Bowel: Stomach is decompressed. 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 Detailed bowel evaluation is limited given lack of enteric contrast. There is wall thickening involving the sigmoid colon with mild pericolonic edema and mesenteric stranding. Sigmoid colon distally is fluid-filled with mild wall thickening. Ileocecal valve and ileal colonic anatomy are not well-defined. No definite small bowel inflammation. The appendix is not confidently identified. Vascular/Lymphatic: No retroperitoneal adenopathy. Abdominal aorta is normal in caliber. Reproductive: Post hysterectomy. Ovaries tentatively identified and normal. Bladder: Minimally distended, no definite wall thickening. Other: Small amount of free fluid dependently in the pelvis. Small amount of free fluid adjacent to the sigmoid  colon. No free air or intra-abdominal abscess. Musculoskeletal: There are no acute or suspicious osseous abnormalities. IMPRESSION: 1. Moderate right hydroureteronephrosis with probable obstructing 5 x 7 mm stone in the distal right ureter. The degree of urothelial thickening and enhancement suggests superimposed urinary tract infection. 2. Findings in the sigmoid colon suggesting active Crohn's disease. Small amount of free fluid, no evidence of abscess. There may be a component of chronic inflammation in the proximal sigmoid, however superimposed acute inflammatory changes are suspected. 3. 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 Electronically Signed   By: Rubye Oaks M.D.   On: 06/28/2016 20:48   Procedures Procedures (including critical care time)  Medications Ordered in ED Medications  sodium chloride 0.9 % bolus 1,000 mL (0 mLs Intravenous Stopped 06/28/16 1908)  morphine 4 MG/ML injection 4 mg (4 mg Intravenous Given 06/28/16 1822)  ondansetron (ZOFRAN) injection 4 mg (4 mg Intravenous Given 06/28/16 1820)  HYDROmorphone (DILAUDID) injection 1 mg (1 mg Intravenous Given 06/28/16 1929)  cefTRIAXone (ROCEPHIN) 1 g in dextrose 5 % 50 mL IVPB (1 g Intravenous New Bag/Given 06/28/16 2034)  iopamidol (ISOVUE-300) 61 % injection 100 mL (100 mLs Intravenous Contrast Given 06/28/16 2009)     Initial Impression / Assessment and Plan / ED Course  I have reviewed the triage vital signs and the nursing notes.  Pertinent labs & imaging results that were available during my care of the patient were reviewed by me and considered in my medical decision making (see chart for details).  Clinical Course    Pt is feeling much better.  I offered her admission due to the UTI and stone, but she wants to go home.  She has an appt with her dr in the morning.  She is given strict instructions to return at any time for  worsening of pain or fever.  I will hold off on treating the Crohn's disease as the steroids might make infxn worse.  She is aware.  She is given the number to urology and to GI.    Final Clinical Impressions(s) / ED Diagnoses   Final diagnoses:  UTI (lower urinary tract infection)  Kidney stone  Crohn's disease of large intestine without complication (HCC)  Nausea    New Prescriptions New Prescriptions   ONDANSETRON (ZOFRAN) 4 MG TABLET    Take 1 tablet (4 mg total) by mouth every 6 (six) hours.   OXYCODONE-ACETAMINOPHEN (PERCOCET/ROXICET) 5-325 MG TABLET    Take 1 tablet  by mouth every 4 (four) hours as needed for severe pain.   SULFAMETHOXAZOLE-TRIMETHOPRIM (BACTRIM DS,SEPTRA DS) 800-160 MG TABLET    Take 1 tablet by mouth 2 (two) times daily.   TAMSULOSIN (FLOMAX) 0.4 MG CAPS CAPSULE    Take 1 capsule (0.4 mg total) by mouth daily.  I personally performed the services described in this documentation, which was scribed in my presence. The recorded information has been reviewed and is accurate.    Jacalyn Lefevre, MD 06/28/16 2118

## 2016-06-28 NOTE — ED Triage Notes (Signed)
Pt reports right groin pain 'ovary pain' x 1 week.  Denies vaginal discharge.

## 2016-06-29 ENCOUNTER — Ambulatory Visit (INDEPENDENT_AMBULATORY_CARE_PROVIDER_SITE_OTHER): Payer: BLUE CROSS/BLUE SHIELD | Admitting: Family Medicine

## 2016-06-29 ENCOUNTER — Encounter: Payer: Self-pay | Admitting: Family Medicine

## 2016-06-29 VITALS — BP 118/77 | HR 100 | Temp 98.1°F | Ht 59.0 in | Wt 153.4 lb

## 2016-06-29 DIAGNOSIS — K5000 Crohn's disease of small intestine without complications: Secondary | ICD-10-CM | POA: Diagnosis not present

## 2016-06-29 DIAGNOSIS — N390 Urinary tract infection, site not specified: Secondary | ICD-10-CM

## 2016-06-29 DIAGNOSIS — F341 Dysthymic disorder: Secondary | ICD-10-CM | POA: Diagnosis not present

## 2016-06-29 DIAGNOSIS — N200 Calculus of kidney: Secondary | ICD-10-CM | POA: Diagnosis not present

## 2016-06-29 MED ORDER — FLUOXETINE HCL 20 MG PO TABS: 40.0000 mg | ORAL_TABLET | Freq: Every day | ORAL | 5 refills | Status: DC

## 2016-06-29 MED FILL — FLUoxetine HCL 20 MG CAPS: 20 | 30 days supply | Qty: 60 | Fill #0

## 2016-06-29 NOTE — Progress Notes (Signed)
Pre visit review using our clinic review tool, if applicable. No additional management support is needed unless otherwise documented below in the visit note. 

## 2016-06-29 NOTE — Progress Notes (Signed)
[facility] [address] [place], [place] [address] [phone]  Date:  06/29/2016   Name:  [name]   DOB:  12/25/1970   MRN:  [id number]  PCP:  Abbe Amsterdam, MD    Chief Complaint: Hospitalization Follow-up (Pt was seen in ER last night 06/18/16 and was dx with kidney stones, UTI and crohn's flare up.)   History of Present Illness:  [name] is a 45 y.o. very pleasant female patient who presents with the following:  History of Crohn's disease, mild HTN; seen in the ER yesterday with RLQ pain.  She had a CT scan that showed moderate right hydroureteronephrosis with 5x62mm stone, obstructing the right distal ureter. Also noted likely UTI which is being treated with Septra   IMPRESSION: 1. Moderate right hydroureteronephrosis with probable obstructing 5 x 7 mm stone in the distal right ureter. The degree of urothelial thickening and enhancement suggests superimposed urinary tract infection. 2. Findings in the sigmoid colon suggesting active Crohn's disease. Small amount of free fluid, no evidence of abscess. There may be a component of chronic inflammation in the proximal sigmoid, however superimposed acute inflammatory changes are suspected. 3. 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  She did have a stone in the distant past- at age 51 or so.  She noted onset of the pain 8 days ago, yesterday she finally went in for eval as it had gotten really bad.  She is feeling better today, but does not think she has passed the stone.  Her pain is improved; she is taking septra, flomax and pain medication.    She is able to eat some now.  She has not noted any fever.   Her RLQ pain is still present but it is better.   She has pressure with urination but no pain, no blood  LMP: 6/29 Lab Results  Component Value Date   PREGTESTUR NEGATIVE 06/28/2016     We started her on prozac 20 about 6 weeks ago. She feels like her mood is more steady, and her husband has noted an improvement.  However she would like to try going up on this dose  She has a long history of Crohn's disease but has not seen GI in a long time; she needs a referral for this also She is taking 25 mg of HCTZ which is controlling her BP well.  Her creat had increased from approx 0.8 to 1.8 at the ER yesterday likely due to obstruction   BP Readings from Last 3 Encounters:  06/29/16 118/77  06/28/16 145/97  05/14/16 (!) 152/100    Patient Active Problem List   Diagnosis Date Noted  . Essential hypertension 05/14/2016  . Irregular menstrual bleeding 05/14/2016  . Crohn's disease (HCC) 05/14/2016    Past Medical History:  Diagnosis Date  . Blood transfusion [address], [place]  . Crohn's disease (HCC)   . GERD (gastroesophageal reflux disease)    No meds  . H/O hiatal hernia   . Hypertension   . Shortness of breath     Past Surgical History:  Procedure Laterality Date  . CYSTOSCOPY  04/22/2012   Procedure: CYSTOSCOPY;  Surgeon: Garnett Farm, MD;  Location: [facility];  Service: Urology;  Laterality: N/A;  . LAPAROSCOPIC SUPRACERVICAL HYSTERECTOMY  04/22/2012   Procedure: LAPAROSCOPIC SUPRACERVICAL HYSTERECTOMY;  Surgeon:  Oliver Pila, MD;  Location: [facility];  Service: Gynecology;  Laterality: N/A;  . PUBOVAGINAL SLING  04/22/2012   Procedure: Leonides Grills;  Surgeon: Garnett Farm, MD;  Location: [facility];  Service: Urology;  Laterality: N/A;  superpubic sling  . vag del x2      Social History  Substance Use Topics  . Smoking status: Never Smoker  . Smokeless tobacco: Never Used  . Alcohol use 0.6 - 1.2 oz/week    1 - 2 Standard drinks or equivalent per week     Comment: occasional wine    Family History  Problem Relation Age of Onset  . Hypertension Mother   . Hypertension Father   . Diabetes  Father   . Alzheimer's disease Father   . Throat cancer Father     No Known Allergies  Medication list has been reviewed and updated.  Current Outpatient Prescriptions on File Prior to Visit  Medication Sig Dispense Refill  . FLUoxetine (PROZAC) 20 MG tablet Take 20 mg by mouth daily.    . hydrochlorothiazide (HYDRODIURIL) 25 MG tablet Start with a 1/2 tablet once daily. Increase to a whole as needed 60 tablet 11  . Multiple Vitamin (MULTIVITAMIN) tablet Take 1 tablet by mouth daily.    . ondansetron (ZOFRAN) 4 MG tablet Take 1 tablet (4 mg total) by mouth every 6 (six) hours. 12 tablet 0  . oxyCODONE-acetaminophen (PERCOCET/ROXICET) 5-325 MG tablet Take 1 tablet by mouth every 4 (four) hours as needed for severe pain. 15 tablet 0  . sulfamethoxazole-trimethoprim (BACTRIM DS,SEPTRA DS) 800-160 MG tablet Take 1 tablet by mouth 2 (two) times daily. 14 tablet 0  . tamsulosin (FLOMAX) 0.4 MG CAPS capsule Take 1 capsule (0.4 mg total) by mouth daily. 14 capsule 0   No current facility-administered medications on file prior to visit.     Review of Systems:  As per HPI- otherwise negative.   Physical Examination: Vitals:   06/29/16 1011  BP: 118/77  Pulse: 100  Temp: 98.1 F (36.7 C)   Vitals:   06/29/16 1011  Weight: 153 lb 6.4 oz (69.6 kg)  Height:  (1.499 m)   Body mass index is 30.98 kg/m. Ideal Body Weight: Weight in (lb) to have BMI = 25: 123.5  GEN: WDWN, NAD, Non-toxic, A & O x 3, overweight, looks well HEENT: Atraumatic, Normocephalic. Neck supple. No masses, No LAD. Ears and Nose: No external deformity. CV: RRR, No M/G/R. No JVD. No thrill. No extra heart sounds. PULM: CTA B, no wheezes, crackles, rhonchi. No retractions. No resp. distress. No accessory muscle use. ABD: S, ND, +BS. No rebound. No HSM.  Right lower quadrant tenderness which is significant still but which pt states is much improved since yesterday.   EXTR: No c/c/e NEURO Normal gait.  PSYCH:  Normally interactive. Conversant. Not depressed or anxious appearing.  Calm demeanor.    Assessment and Plan: Urinary tract infection, site not specified  Kidney stone - Plan: Ambulatory referral to Urology  Dysthymia - Plan: FLUoxetine (PROZAC) 20 MG tablet  Crohn's disease of small intestine without complication (HCC) - Plan: Ambulatory referral to Gastroenterology  Here today to follow-up from ER visit yesterday.  She was seen for pain from a right ureteral stone and associated UTI She is improved today with pain medication and abx.  However, this stone may not be able to pass without intervention.  Made her a urology appt tomorrow am Urine culture from ER pending In the meantime  she will continue current tx but hold her BP medication as her creatinine is elevated  Will increase her prozac to 40 mg Referral to GI as she needs to establish care/ eval crohn's disease   See patient instructions for more details.     Signed Abbe Amsterdam, MD

## 2016-06-29 NOTE — Patient Instructions (Addendum)
Continue to use your pain medication and antibiotic for your UTI and kidney stone.  Please screen your urine to try and catch your stone and save it if possible.  Your urine culture is pending   You have an appt tomorrow at at 9:15- with [facility] urology, Dr. Sabino Gasser Address: [address] [place], [place] [address]            Phone: [phone]  If you are getting worse- fever, increasing pain, or vomiting please contact me or go to the ER right away! For the time being stop the blood pressure medication; it is putting stress on your kidneys which are already irritated. If your urologist says you can restart this medication  We can certainly increase your prozac to 40 mg; take 2 of the 20 mg daily

## 2016-06-30 LAB — URINE CULTURE

## 2016-07-02 ENCOUNTER — Encounter: Payer: Self-pay | Admitting: Family Medicine

## 2016-07-02 ENCOUNTER — Ambulatory Visit: Payer: BLUE CROSS/BLUE SHIELD | Admitting: Family Medicine

## 2016-07-02 DIAGNOSIS — N200 Calculus of kidney: Secondary | ICD-10-CM | POA: Insufficient documentation

## 2016-07-06 ENCOUNTER — Encounter: Payer: Self-pay | Admitting: Internal Medicine

## 2016-07-23 MED FILL — HYDROCHLOROTHIAZIDE 25 MG T: 25 | 60 days supply | Qty: 60 | Fill #1

## 2016-08-17 ENCOUNTER — Ambulatory Visit: Payer: BLUE CROSS/BLUE SHIELD | Admitting: Family Medicine

## 2016-08-19 MED FILL — FLUoxetine HCL 20 MG CAPS: 20 | 30 days supply | Qty: 60 | Fill #1

## 2016-09-09 ENCOUNTER — Ambulatory Visit: Payer: BLUE CROSS/BLUE SHIELD | Admitting: Internal Medicine

## 2016-09-18 MED FILL — FLUoxetine HCL 20 MG CAPS: 20 | 30 days supply | Qty: 60 | Fill #2

## 2016-09-18 MED FILL — HYDROCHLOROTHIAZIDE 25 MG T: 25 | 60 days supply | Qty: 60 | Fill #2

## 2016-09-30 ENCOUNTER — Ambulatory Visit: Payer: BLUE CROSS/BLUE SHIELD | Admitting: Family Medicine

## 2016-10-05 ENCOUNTER — Ambulatory Visit (INDEPENDENT_AMBULATORY_CARE_PROVIDER_SITE_OTHER): Payer: BLUE CROSS/BLUE SHIELD | Admitting: Family Medicine

## 2016-10-05 ENCOUNTER — Encounter: Payer: Self-pay | Admitting: Family Medicine

## 2016-10-05 VITALS — BP 128/82 | HR 94 | Temp 98.3°F | Ht 59.0 in | Wt 145.8 lb

## 2016-10-05 DIAGNOSIS — Z23 Encounter for immunization: Secondary | ICD-10-CM

## 2016-10-05 DIAGNOSIS — F341 Dysthymic disorder: Secondary | ICD-10-CM | POA: Diagnosis not present

## 2016-10-05 DIAGNOSIS — N289 Disorder of kidney and ureter, unspecified: Secondary | ICD-10-CM

## 2016-10-05 LAB — BASIC METABOLIC PANEL
BUN: 19 mg/dL (ref 6–23)
CALCIUM: 10.4 mg/dL (ref 8.4–10.5)
CO2: 26 meq/L (ref 19–32)
Chloride: 99 mEq/L (ref 96–112)
Creatinine, Ser: 1.14 mg/dL (ref 0.40–1.20)
GFR: 65.75 mL/min (ref >60.00)
GLUCOSE: 92 mg/dL (ref 70–99)
POTASSIUM: 4.3 meq/L (ref 3.5–5.1)
Sodium: 134 mEq/L — ABNORMAL LOW (ref 135–145)

## 2016-10-05 NOTE — Progress Notes (Signed)
Pre visit review using our clinic review tool, if applicable. No additional management support is needed unless otherwise documented below in the visit note. 

## 2016-10-05 NOTE — Patient Instructions (Signed)
It was good to see you today- I am glad that you are doing well on the 40 mg of prozac.  We gave you a tetanus shot today and will make sure that your kidney function is back to normal (it was abnormal back in July when you had the stone)  Please see me in 4-6 months to check on your progress

## 2016-10-05 NOTE — Progress Notes (Signed)
[facility] [address], [address] [place], [place] [address] [phone]  Date:  10/05/2016   Name:  [name]   DOB:  [birth year]   MRN:  [id number]  PCP:  Abbe Amsterdam, MD    Chief Complaint: Follow-up (Pt here for f/u visit. Tolerating Prozac well but will still having bleeding when upset/stressed. )   History of Present Illness:  [name] is a 45 y.o. very pleasant female patient who presents with the following:  Here today for a follow-up visit from July of this year- we started her on prozac in June of this year.  At follow-up in July she was feeling better as far as her mood.  Partial HPI from June She had a partial hysterectomy in 2013- per op note it appears that she still has her cervix and ovaries; per pt she has part of her uterus as they did not want to cut through a portion that had endometriosis.   She still has vaginal bleeding off and on- her OB is aware of this  Today she reports that she is "doing good, everytihng is doing real good."  Her family is doing well.   She feels like her prozac is working well for her- we upped her dose to 40 mg.  She has noted some vaginal bleeding more when she is "stressed out," and that sometimes she may bleed twice a month. Her OBG is Dr. Hyacinth Meeker. She last saw her about 4 months ago.   They think that she will continue to have p She is sleeping well, her appetite is good.  She is currently in school No SI She declines a flu shot today She is due for a tetanus shot and would like to get this done today  Patient Active Problem List   Diagnosis Date Noted  . Nephrolithiasis 07/02/2016  . Essential hypertension 05/14/2016  . Irregular menstrual bleeding 05/14/2016  . Crohn's disease (HCC) 05/14/2016    Past Medical History:  Diagnosis Date  . Blood transfusion [address] [place]  . Crohn's disease (HCC)   . GERD (gastroesophageal reflux disease)    No meds  . H/O hiatal hernia   . Hypertension   . Shortness of breath     Past Surgical History:  Procedure Laterality Date  . CYSTOSCOPY  04/22/2012   Procedure: CYSTOSCOPY;  Surgeon: Garnett Farm, MD;  Location: [facility];  Service: Urology;  Laterality: N/A;  . LAPAROSCOPIC SUPRACERVICAL HYSTERECTOMY  04/22/2012   Procedure: LAPAROSCOPIC SUPRACERVICAL HYSTERECTOMY;  Surgeon: Oliver Pila, MD;  Location: [facility];  Service: Gynecology;  Laterality: N/A;  . PUBOVAGINAL SLING  04/22/2012   Procedure: Leonides Grills;  Surgeon: Garnett Farm, MD;  Location: [facility];  Service: Urology;  Laterality: N/A;  superpubic sling  . vag del x2      Social History  Substance Use Topics  . Smoking status: Never Smoker  . Smokeless tobacco: Never Used  . Alcohol use 0.6 - 1.2 oz/week    1 - 2 Standard drinks or equivalent per week     Comment: occasional wine    Family History  Problem Relation Age of Onset  . Hypertension Mother   . Hypertension Father   . Diabetes Father   . Alzheimer's disease Father   . Throat cancer Father     No Known Allergies  Medication list has been reviewed and updated.  Current  Outpatient Prescriptions on File Prior to Visit  Medication Sig Dispense Refill  . FLUoxetine (PROZAC) 20 MG tablet Take 2 tablets (40 mg total) by mouth daily. 60 tablet 5  . hydrochlorothiazide (HYDRODIURIL) 25 MG tablet Start with a 1/2 tablet once daily. Increase to a whole as needed 60 tablet 11  . Multiple Vitamin (MULTIVITAMIN) tablet Take 1 tablet by mouth daily.    . ondansetron (ZOFRAN) 4 MG tablet Take 1 tablet (4 mg total) by mouth every 6 (six) hours. 12 tablet 0  . oxyCODONE-acetaminophen (PERCOCET/ROXICET) 5-325 MG tablet Take 1 tablet by mouth every 4 (four) hours as needed for severe pain. 15 tablet 0  . tamsulosin (FLOMAX) 0.4 MG CAPS capsule Take 1 capsule (0.4 mg total) by mouth daily. 14 capsule 0   No current facility-administered medications on file prior  to visit.     Review of Systems:  As per HPI- otherwise negative. No fever, chills, nausea, vomiting, rash   Physical Examination: Vitals:   10/05/16 0930  BP: 128/82  Pulse: 94  Temp: 98.3 F (36.8 C)   Vitals:   10/05/16 0930  Weight: 145 lb 12.8 oz (66.1 kg)  Height: 4\' 11"  (1.499 m)   Body mass index is 29.45 kg/m. Ideal Body Weight: Weight in (lb) to have BMI = 25: 123.5  GEN: WDWN, NAD, Non-toxic, A & O x 3, overweight, looks well HEENT: Atraumatic, Normocephalic. Neck supple. No masses, No LAD. Ears and Nose: No external deformity. CV: RRR, No M/G/R. No JVD. No thrill. No extra heart sounds. PULM: CTA B, no wheezes, crackles, rhonchi. No retractions. No resp. distress. No accessory muscle use. ABD: S, NT, ND EXTR: No c/c/e NEURO Normal gait.  PSYCH: Normally interactive. Conversant. Not depressed or anxious appearing.  Calm demeanor.    Assessment and Plan: Dysthymia  Immunization due - Plan: Tdap vaccine greater than or equal to 7yo IM  Acute renal insufficiency - Plan: Basic metabolic panel  Doing well on prozac 40- she feels that her mood is overall much better and that her current treatment is ok. Advised her that we probably cannot control stress enough to eliminate her menstrual bleeding.   tdap today Will recheck her renal function today Plan to follow-up in 4-6 months   Signed Abbe AmsterdamJessica Caden Fukushima, MD

## 2016-10-26 MED FILL — FLUoxetine HCL 20 MG CAPS: 20 | 30 days supply | Qty: 60 | Fill #3

## 2016-11-18 MED FILL — HYDROCHLOROTHIAZIDE 25 MG T: 25 | 60 days supply | Qty: 60 | Fill #3

## 2016-11-20 MED FILL — FLUoxetine HCL 20 MG CAPS: 20 | 30 days supply | Qty: 60 | Fill #4

## 2016-12-23 MED FILL — FLUoxetine HCL 20 MG CAPS: 20 | 30 days supply | Qty: 60 | Fill #5

## 2017-01-19 ENCOUNTER — Encounter: Payer: Self-pay | Admitting: Family Medicine

## 2017-01-20 ENCOUNTER — Other Ambulatory Visit: Payer: Self-pay | Admitting: Family Medicine

## 2017-01-20 DIAGNOSIS — F341 Dysthymic disorder: Secondary | ICD-10-CM

## 2017-01-20 MED FILL — FLUoxetine HCL 20 MG CAPS: 20 | 30 days supply | Qty: 60 | Fill #0

## 2017-01-20 MED FILL — HYDROCHLOROTHIAZIDE 25 MG T: 25 | 60 days supply | Qty: 60 | Fill #4

## 2017-01-25 ENCOUNTER — Ambulatory Visit (INDEPENDENT_AMBULATORY_CARE_PROVIDER_SITE_OTHER): Payer: BLUE CROSS/BLUE SHIELD | Admitting: Family Medicine

## 2017-01-25 VITALS — BP 110/74 | HR 93 | Temp 97.9°F | Ht 59.0 in | Wt 146.0 lb

## 2017-01-25 DIAGNOSIS — H9313 Tinnitus, bilateral: Secondary | ICD-10-CM

## 2017-01-25 NOTE — Progress Notes (Signed)
[facility] [address] [place], [place] [address] [phone]  Date:  01/25/2017   Name:  [name]   DOB:  30-Nov-1971   MRN:  [id number]  PCP:  Abbe Amsterdam, MD    Chief Complaint: Tinnitus (c/o bilateral ringing in ears x 1 years. 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 ) and Osteoarthritis   History of Present Illness:  [name] is a 46 y.o. very pleasant female patient who presents with the following:  She is here today with ringing in her bilateral ears for about one year.   They both seem to be affected equally.  She notes the ringing more when she is quiet at night.  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    Patient Active Problem List   Diagnosis Date Noted  . Nephrolithiasis 07/02/2016  . Essential hypertension 05/14/2016  . Irregular menstrual bleeding 05/14/2016  . Crohn's disease (HCC) 05/14/2016    Past Medical History:  Diagnosis Date  . Blood transfusion [address], [place]  . Crohn's disease (HCC)   . GERD (gastroesophageal reflux disease)    No meds  . H/O hiatal hernia   . Hypertension   . Shortness of breath     Past Surgical History:  Procedure Laterality Date  . CYSTOSCOPY  04/22/2012   Procedure: CYSTOSCOPY;  Surgeon: Garnett Farm, MD;  Location: [facility];  Service: Urology;  Laterality: N/A;  . LAPAROSCOPIC SUPRACERVICAL HYSTERECTOMY  04/22/2012   Procedure: LAPAROSCOPIC SUPRACERVICAL HYSTERECTOMY;  Surgeon: Oliver Pila, MD;  Location: [facility];  Service: Gynecology;  Laterality: N/A;  . PUBOVAGINAL SLING  04/22/2012   Procedure: Leonides Grills;  Surgeon: Garnett Farm, MD;  Location: [facility];  Service: Urology;   Laterality: N/A;  superpubic sling  . vag del x2      Social History  Substance Use Topics  . Smoking status: Never Smoker  . Smokeless tobacco: Never Used  . Alcohol use 0.6 - 1.2 oz/week    1 - 2 Standard drinks or equivalent per week     Comment: occasional wine    Family History  Problem Relation Age of Onset  . Hypertension Mother   . Hypertension Father   . Diabetes Father   . Alzheimer's disease Father   . Throat cancer Father     No Known Allergies  Medication list has been reviewed and updated.  Current Outpatient Prescriptions on File Prior to Visit  Medication Sig Dispense Refill  . FLUoxetine (PROZAC) 20 MG capsule TAKE 2 CAPSULES (40 MG TOTAL) BY MOUTH DAILY. (Patient taking differently: Take 1 capsule daily) 60 capsule 5  . hydrochlorothiazide (HYDRODIURIL) 25 MG tablet Start with a 1/2 tablet once daily. Increase to a whole as needed (Patient taking differently: 25 mg. Start with a 1/2 tablet once daily. Increase to a whole as needed) 60 tablet 11  . Multiple Vitamin (MULTIVITAMIN) tablet Take 1 tablet by mouth daily.     No current facility-administered medications on file prior to visit.     Review of Systems:  She notes some dry throat and occasional cough-  otherwise feeling well.  No fever, chills, sneezing, congestion noted   Physical Examination: Vitals:   01/25/17 1123  BP: 110/74  Pulse: 93  Temp: 97.9 F (36.6 C)   Vitals:   01/25/17 1123  Weight: 146 lb (66.2 kg)  Height: 4\' 11"  (1.499 m)   Body mass index is 29.49 kg/m. Ideal Body Weight: Weight in (lb) to have BMI = 25: 123.5  GEN: WDWN, NAD, Non-toxic, A & O x 3 HEENT: Atraumatic, Normocephalic. Neck supple. No masses, No LAD.  Bilateral TM wnl, oropharynx normal.  PEERL,EOMI.   Normal clear ear canals bilaterally  Ears and Nose: No external deformity. CV: RRR, No M/G/R. No JVD. No thrill. No extra heart sounds. PULM: CTA B, no wheezes, crackles, rhonchi. No retractions. No  resp. distress. No accessory muscle use. EXTR: No c/c/e NEURO Normal gait.  PSYCH: Normally interactive. Conversant. Not depressed or anxious appearing.  Calm demeanor.    Assessment and Plan: Bilateral tinnitus - Plan: Ambulatory referral to ENT  Will refer to ENT to further evaluate her tinnitus but did give her the expectation that there will likely NOT be any particular cause or cure.  Discussed other measures to live with this condition and protect her hearing   Signed Abbe Amsterdam, MD

## 2017-01-25 NOTE — Progress Notes (Signed)
Pre visit review using our clinic review tool, if applicable. No additional management support is needed unless otherwise documented below in the visit note. 

## 2017-01-25 NOTE — Patient Instructions (Signed)
Your condition (ringing in your ears) is also called tinnitus.  Often we cannot find any cause - or cure- for this problem.  However I think it is worth having ENT evaluate you to make sure there is nothing else contributing.  I will set this up for you; in the meantime protect your hearing from loud noises (wear ear plugs if you have to be around loud noises) and try white noise (fan or white noise app on your phone) when the sound is annoying you!

## 2017-02-22 IMAGING — CT CT ABD-PELV W/ CM
2 of 6 series · 15 of 46 positions shown, 17 images · IV contrast (APPLIED)
Comparison: None.

CLINICAL DATA: Right lower quadrant pain for 1 week. Patient with
history of Crohn's disease.

EXAM:
CT ABDOMEN AND PELVIS WITH CONTRAST
TECHNIQUE: Multidetector CT imaging of the abdomen and pelvis was performed
using the standard protocol following bolus administration of
intravenous contrast.
CONTRAST:  100mL ZQCVO6-BJJ IOPAMIDOL (ZQCVO6-BJJ) INJECTION 61%

[Series 5: coronal st · coronal · 0.67mm/px · 3 of 90 slices shown]
[im 30/90  soft-tissue]
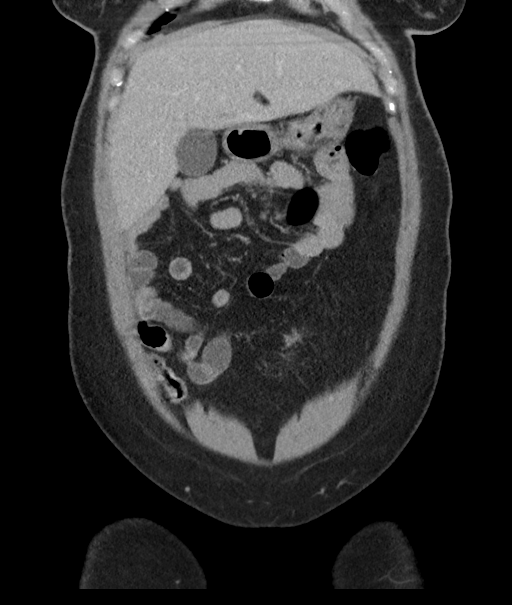
[im 40/90  soft-tissue]
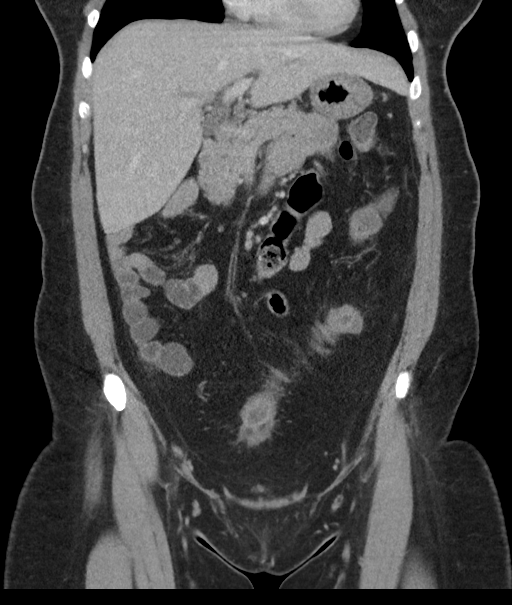
[im 50/90  soft-tissue]
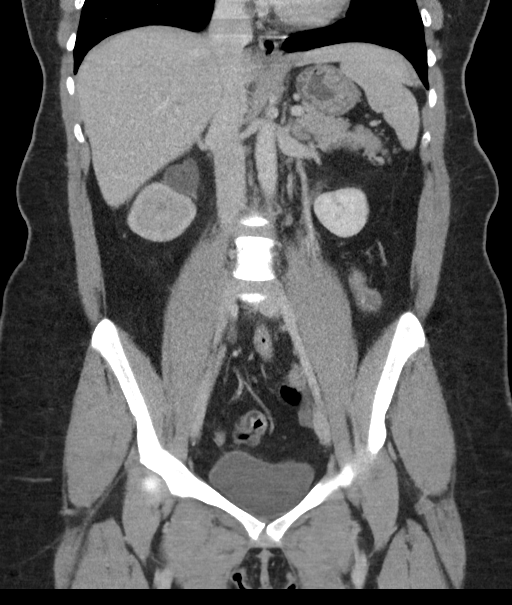

[Series 8: axial st · axial · 0.75mm/px · z∈[-731,-381]mm · 12 of 80 slices shown, 14 images]
[im 5/80  soft-tissue]
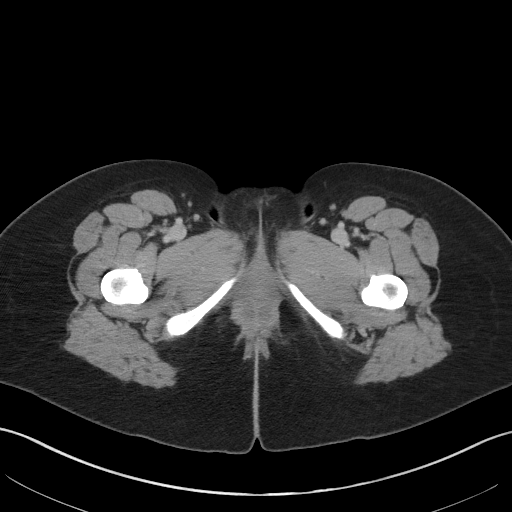
[im 5/80  bone]
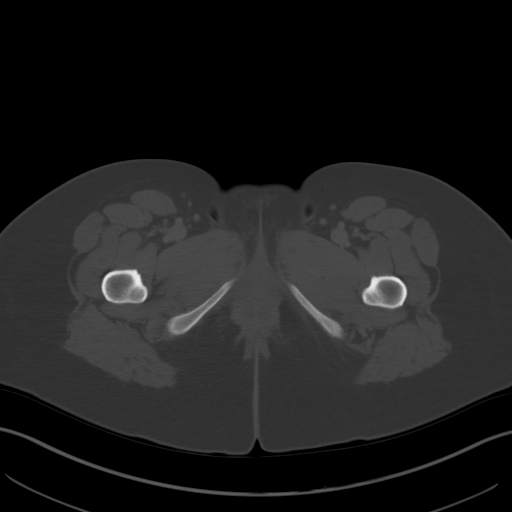
[im 10/80  soft-tissue]
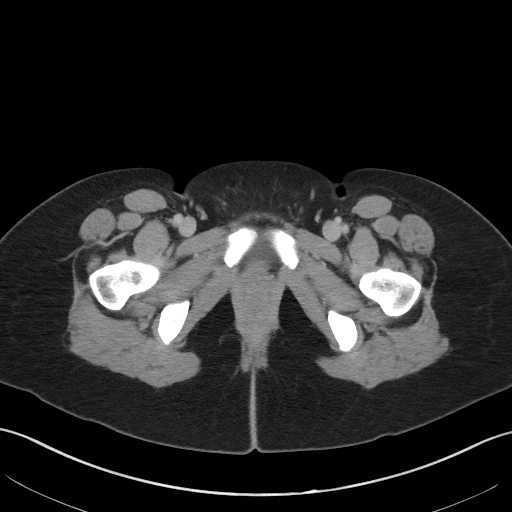
[im 20/80  soft-tissue]
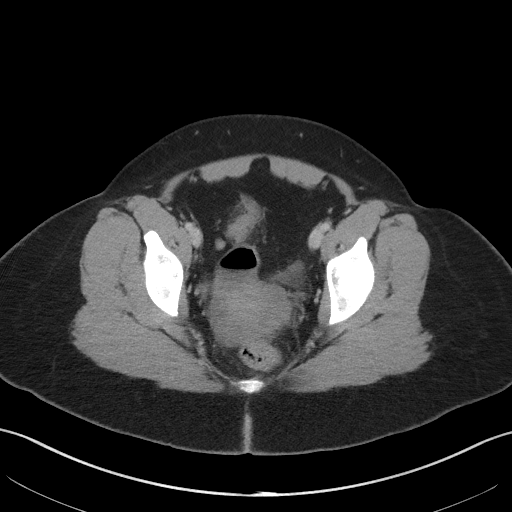
[im 25/80  soft-tissue]
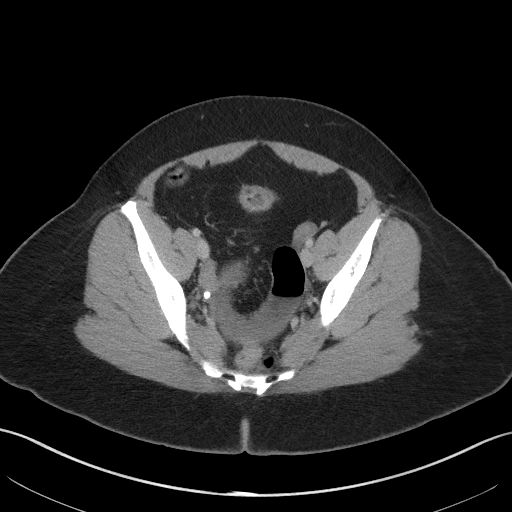
[im 30/80  soft-tissue]
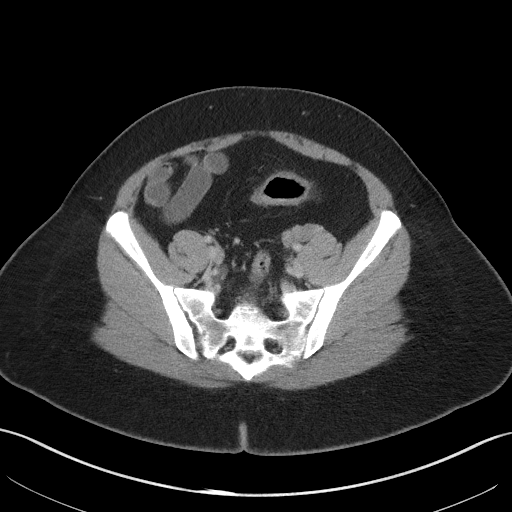
[im 35/80  soft-tissue]
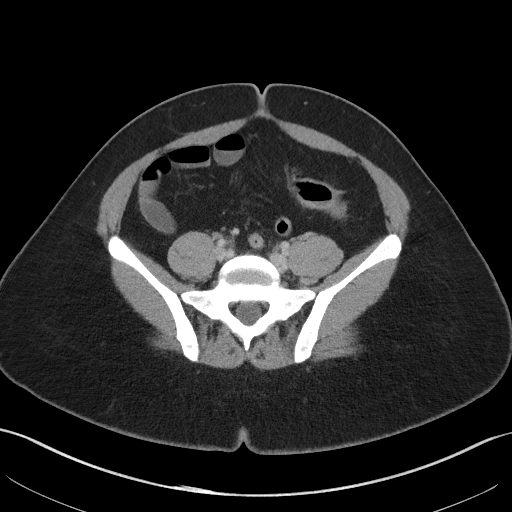
[im 45/80  soft-tissue]
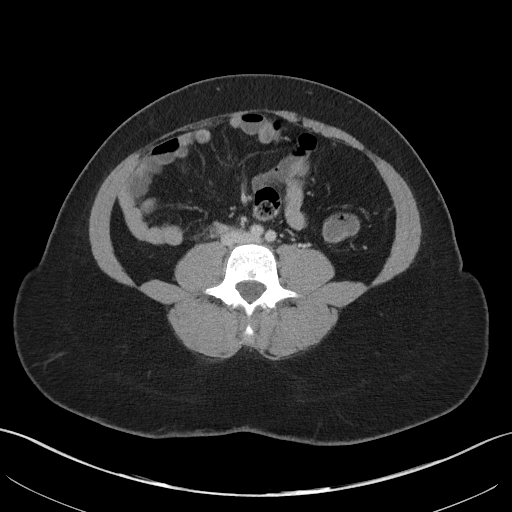
[im 50/80  soft-tissue]
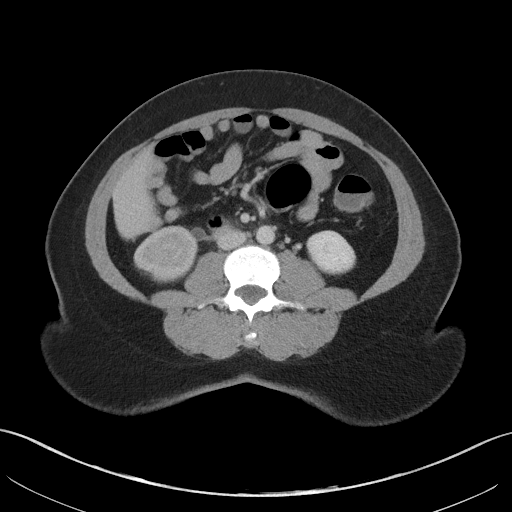
[im 55/80  soft-tissue]
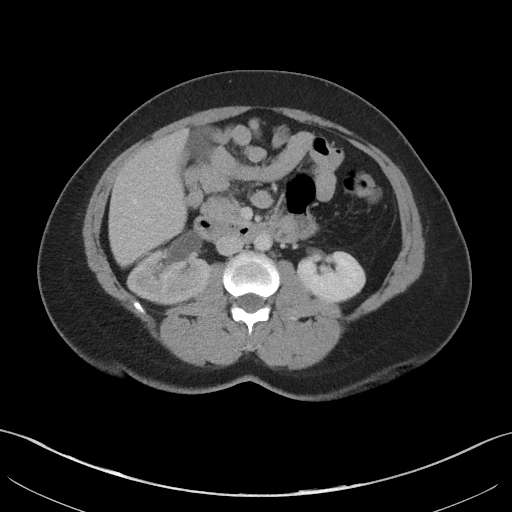
[im 55/80  bone]
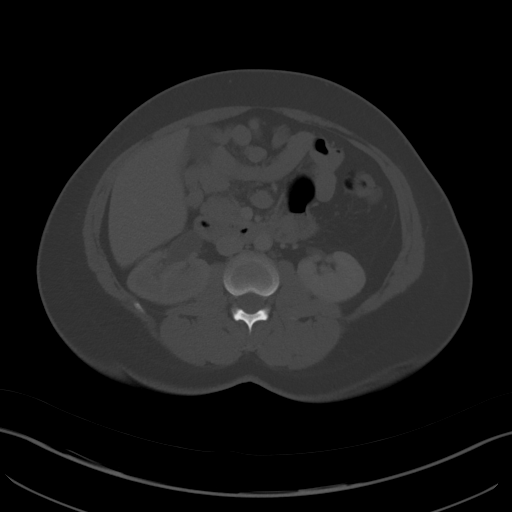
[im 60/80  soft-tissue]
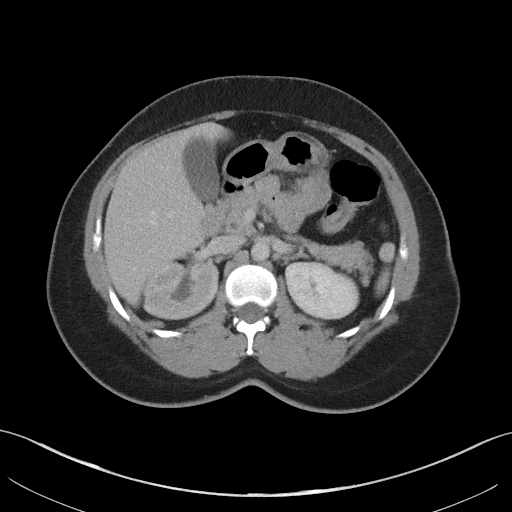
[im 70/80  soft-tissue]
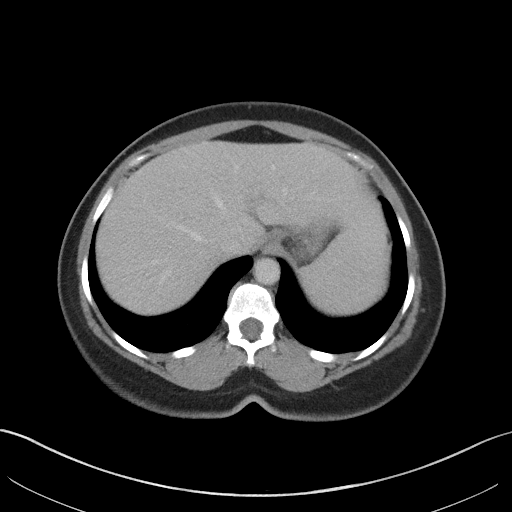
[im 75/80  soft-tissue]
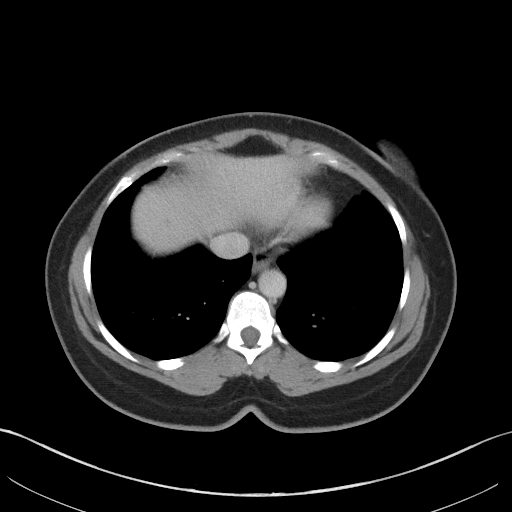

[15 of 46 positions shown; findings below may reference images not displayed]

FINDINGS: Lower chest: Minimal scarring or atelectasis at the left greater
than right dependent lung base. No pleural effusion.

Liver: Homogeneous attenuation.  No focal lesion.

Hepatobiliary: Gallbladder physiologically distended, no calcified
stone. No biliary dilatation.

Pancreas: No ductal dilatation or inflammation.

Spleen: Normal.

Adrenal glands: No nodule.

Kidneys: Right renal enlargement with heterogeneous enhancement and
delayed renal excretion. There is moderate right hydronephrosis and
proximal hydroureter with urothelial thickening. Probable distal
right ureteral stone measures 5 x 7 mm. There is mild right
perinephric edema. Normal left renal enhancement.

Stomach/Bowel: Stomach is decompressed. Probable bowel malrotation
with small bowel located in the right abdomen and ascending colon in
the central abdomen. Detailed bowel evaluation is limited given lack
of enteric contrast. There is wall thickening involving the sigmoid
colon with mild pericolonic edema and mesenteric stranding. Sigmoid
colon distally is fluid-filled with mild wall thickening. Ileocecal
valve and ileal colonic anatomy are not well-defined. No definite
small bowel inflammation. The appendix is not confidently
identified.

Vascular/Lymphatic: No retroperitoneal adenopathy. Abdominal aorta
is normal in caliber.

Reproductive: Post hysterectomy. Ovaries tentatively identified and
normal.

Bladder: Minimally distended, no definite wall thickening.

Other: Small amount of free fluid dependently in the pelvis. Small
amount of free fluid adjacent to the sigmoid colon. No free air or
intra-abdominal abscess.

Musculoskeletal: There are no acute or suspicious osseous
abnormalities.
IMPRESSION: 1. Moderate right hydroureteronephrosis with probable obstructing 5
x 7 mm stone in the distal right ureter. The degree of urothelial
thickening and enhancement suggests superimposed urinary tract
infection.
2. Findings in the sigmoid colon suggesting active Crohn's disease.
Small amount of free fluid, no evidence of abscess. There may be a
component of chronic inflammation in the proximal sigmoid, however
superimposed acute inflammatory changes are suspected.
3. Probable bowel malrotation with small bowel in the right abdomen
and ascending colon in the midline. This is likely congenital.

## 2017-03-02 MED FILL — FLUoxetine HCL 20 MG CAPS: 20 | 30 days supply | Qty: 60 | Fill #1

## 2017-03-19 MED FILL — HYDROCHLOROTHIAZIDE 25 MG T: 25 | 60 days supply | Qty: 60 | Fill #5

## 2017-04-02 MED FILL — FLUoxetine HCL 20 MG CAPS: 20 | 30 days supply | Qty: 60 | Fill #2

## 2017-04-04 NOTE — Progress Notes (Addendum)
[facility] [address] [place], [place] [address] [phone]  Date:  04/05/2017   Name:  [name]   DOB:  1970/12/17   MRN:  [id number]  PCP:  Pearline Cables, MD    Chief Complaint: Follow-up (Pt here for f/u visit. Went to ENT a few weeks ago for tinnitus and there is no cure. Still tolerating meds well. No new concerns at this time. )   History of Present Illness:  [name] is a 46 y.o. very pleasant female patient who presents with the following:  History of Crohn's disease, GERD, HTN Here today for a periodic recheck Last full labs about 10 months ago She did see ENT about her tinnitus- she was dx with hearing loss.  For the time being they are observing this. She also runs a fan at night for her tinnitus  Her stomach sx are ok - she does not have any current trouble with her Crohn's disease and is not on any medication for same   BP Readings from Last 3 Encounters:  04/05/17 122/84  01/25/17 110/74  10/05/16 128/82   She does check her BP at home on occasion and it generally looks ok   She is fasting today for labs  She is graduating on Saturday; [employer]. 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  She is very excited about this. She is finishing up her exams this week.    Her prozac is working well for her  She is taking 25 mg of hctz daily  She does not currently have a GI doctor- had to cancel an appt due to lack of funds. She plans to reschedule soon   Patient Active Problem List   Diagnosis Date Noted  . Nephrolithiasis 07/02/2016  . Essential hypertension 05/14/2016  . Irregular menstrual bleeding 05/14/2016  . Crohn's disease (HCC) 05/14/2016    Past Medical History:  Diagnosis Date  . Blood transfusion [address], [place]  . Crohn's disease (HCC)   . GERD (gastroesophageal reflux disease)    No meds  . H/O hiatal hernia   . Hypertension   . Shortness of  breath     Past Surgical History:  Procedure Laterality Date  . CYSTOSCOPY  04/22/2012   Procedure: CYSTOSCOPY;  Surgeon: Garnett Farm, MD;  Location: [facility];  Service: Urology;  Laterality: N/A;  . LAPAROSCOPIC SUPRACERVICAL HYSTERECTOMY  04/22/2012   Procedure: LAPAROSCOPIC SUPRACERVICAL HYSTERECTOMY;  Surgeon: Oliver Pila, MD;  Location: [facility];  Service: Gynecology;  Laterality: N/A;  . PUBOVAGINAL SLING  04/22/2012   Procedure: Leonides Grills;  Surgeon: Garnett Farm, MD;  Location: [facility];  Service: Urology;  Laterality: N/A;  superpubic sling  . vag del x2      Social History  Substance Use Topics  . Smoking status: Never Smoker  . Smokeless tobacco: Never Used  . Alcohol use 0.6 - 1.2 oz/week    1 - 2 Standard drinks or equivalent per week     Comment: occasional wine    Family History  Problem Relation Age of Onset  . Hypertension Mother   . Hypertension Father   . Diabetes Father   . Alzheimer's disease Father   . Throat cancer Father     No Known Allergies  Medication list has been reviewed and updated.  Current Outpatient Prescriptions on File Prior to Visit  Medication  Sig Dispense Refill  . FLUoxetine (PROZAC) 20 MG capsule TAKE 2 CAPSULES (40 MG TOTAL) BY MOUTH DAILY. (Patient taking differently: Take 1 capsule daily) 60 capsule 5  . hydrochlorothiazide (HYDRODIURIL) 25 MG tablet Start with a 1/2 tablet once daily. Increase to a whole as needed (Patient taking differently: 25 mg. Start with a 1/2 tablet once daily. Increase to a whole as needed) 60 tablet 11  . Multiple Vitamin (MULTIVITAMIN) tablet Take 1 tablet by mouth daily.     No current facility-administered medications on file prior to visit.     Review of Systems:  As per HPI- otherwise negative. No fever, chills, CP, SOB, rash, unexpected weight change    Physical Examination: Vitals:   04/05/17 0901  BP: 122/84  Pulse: 82  Temp: 98.8 F (37.1 C)   Vitals:   04/05/17 0901   Weight: 152 lb (68.9 kg)  Height: 4\' 11"  (1.499 m)   Body mass index is 30.7 kg/m. Ideal Body Weight: Weight in (lb) to have BMI = 25: 123.5  GEN: WDWN, NAD, Non-toxic, A & O x 3, overweight, looks well HEENT: Atraumatic, Normocephalic. Neck supple. No masses, No LAD.  Bilateral TM wnl, oropharynx normal.  PEERL,EOMI.   Ears and Nose: No external deformity. CV: RRR, No M/G/R. No JVD. No thrill. No extra heart sounds. PULM: CTA B, no wheezes, crackles, rhonchi. No retractions. No resp. distress. No accessory muscle use. ABD: S, NT, ND EXTR: No c/c/e NEURO Normal gait.  PSYCH: Normally interactive. Conversant. Not depressed or anxious appearing.  Calm demeanor.    Assessment and Plan: Essential hypertension - Plan: Comprehensive metabolic panel  Screening for thyroid disorder - Plan: TSH  Screening for deficiency anemia - Plan: CBC  Screening for diabetes mellitus - Plan: Comprehensive metabolic panel, Hemoglobin A1c  Crohn's disease without complication, unspecified gastrointestinal tract location [facility])  Screening for hyperlipidemia - Plan: Lipid panel  Here today for a periodic recheck BP ok- continue current treatment and monitor CMP Other screening labs as above Encouraged her to follow-up with GI and she plans to do so  Will plan further follow- up pending labs.  Signed Abbe Amsterdam, MD  Labs as below  Results for orders placed or performed in visit on 04/05/17  Comprehensive metabolic panel  Result Value Ref Range   Sodium 135 135 - 145 mEq/L   Potassium 3.6 3.5 - 5.1 mEq/L   Chloride 100 96 - 112 mEq/L   CO2 27 19 - 32 mEq/L   Glucose, Bld 81 70 - 99 mg/dL   BUN 16 6 - 23 mg/dL   Creatinine, Ser 3.81 0.40 - 1.20 mg/dL   Total Bilirubin 0.5 0.2 - 1.2 mg/dL   Alkaline Phosphatase 63 39 - 117 U/L   AST 21 0 - 37 U/L   ALT 18 0 - 35 U/L   Total Protein 7.5 6.0 - 8.3 g/dL   Albumin 4.0 3.5 - 5.2 g/dL   Calcium 9.7 8.4 - 01.7 mg/dL   GFR 51.02 >58.52  mL/min  CBC  Result Value Ref Range   WBC 8.2 4.0 - 10.5 K/uL   RBC 4.11 3.87 - 5.11 Mil/uL   Platelets 375.0 150.0 - 400.0 K/uL   Hemoglobin 12.4 12.0 - 15.0 g/dL   HCT 77.8 24.2 - 35.3 %   MCV 90.5 78.0 - 100.0 fl   MCHC 33.5 30.0 - 36.0 g/dL   RDW 61.4 43.1 - 54.0 %  Lipid panel  Result Value Ref Range  Cholesterol 211 (H) 0 - 200 mg/dL   Triglycerides 338.2 0.0 - 149.0 mg/dL   HDL 50.53 >97.67 mg/dL   VLDL 34.1 0.0 - 93.7 mg/dL   LDL Cholesterol 902 (H) 0 - 99 mg/dL   Total CHOL/HDL Ratio 4    NonHDL 158.55   TSH  Result Value Ref Range   TSH 1.09 0.35 - 4.50 uIU/mL  Hemoglobin A1c  Result Value Ref Range   Hgb A1c MFr Bld 5.8 4.6 - 6.5 %

## 2017-04-05 ENCOUNTER — Ambulatory Visit (INDEPENDENT_AMBULATORY_CARE_PROVIDER_SITE_OTHER): Payer: BLUE CROSS/BLUE SHIELD | Admitting: Family Medicine

## 2017-04-05 ENCOUNTER — Encounter: Payer: Self-pay | Admitting: Family Medicine

## 2017-04-05 VITALS — BP 122/84 | HR 82 | Temp 98.8°F | Ht 59.0 in | Wt 152.0 lb

## 2017-04-05 DIAGNOSIS — I10 Essential (primary) hypertension: Secondary | ICD-10-CM | POA: Diagnosis not present

## 2017-04-05 DIAGNOSIS — Z131 Encounter for screening for diabetes mellitus: Secondary | ICD-10-CM

## 2017-04-05 DIAGNOSIS — K5090 Crohn's disease, unspecified, without complications: Secondary | ICD-10-CM

## 2017-04-05 DIAGNOSIS — Z13220 Encounter for screening for lipoid disorders: Secondary | ICD-10-CM

## 2017-04-05 DIAGNOSIS — Z130 Encounter for screening for diseases of the blood and blood-forming organs and certain disorders involving the immune mechanism: Secondary | ICD-10-CM | POA: Diagnosis not present

## 2017-04-05 DIAGNOSIS — R7303 Prediabetes: Secondary | ICD-10-CM

## 2017-04-05 DIAGNOSIS — Z1329 Encounter for screening for other suspected endocrine disorder: Secondary | ICD-10-CM | POA: Diagnosis not present

## 2017-04-05 LAB — HEMOGLOBIN A1C: Hgb A1c MFr Bld: 5.8 % (ref 4.6–6.5)

## 2017-04-05 LAB — CBC
HCT: 37.2 % (ref 36.0–46.0)
HEMOGLOBIN: 12.4 g/dL (ref 12.0–15.0)
MCHC: 33.5 g/dL (ref 30.0–36.0)
MCV: 90.5 fl (ref 78.0–100.0)
Platelets: 375 10*3/uL (ref 150.0–400.0)
RBC: 4.11 Mil/uL (ref 3.87–5.11)
RDW: 13 % (ref 11.5–15.5)
WBC: 8.2 10*3/uL (ref 4.0–10.5)

## 2017-04-05 LAB — TSH: TSH: 1.09 u[IU]/mL (ref 0.35–4.50)

## 2017-04-05 LAB — LIPID PANEL
Cholesterol: 211 mg/dL — ABNORMAL HIGH (ref 0–200)
HDL: 52.7 mg/dL (ref >39.00)
LDL Cholesterol: 137 mg/dL — ABNORMAL HIGH (ref 0–99)
NONHDL: 158.55
Total CHOL/HDL Ratio: 4
Triglycerides: 110 mg/dL (ref 0.0–149.0)
VLDL: 22 mg/dL (ref 0.0–40.0)

## 2017-04-05 LAB — COMPREHENSIVE METABOLIC PANEL
ALK PHOS: 63 U/L (ref 39–117)
ALT: 18 U/L (ref 0–35)
AST: 21 U/L (ref 0–37)
Albumin: 4 g/dL (ref 3.5–5.2)
BILIRUBIN TOTAL: 0.5 mg/dL (ref 0.2–1.2)
BUN: 16 mg/dL (ref 6–23)
CO2: 27 mEq/L (ref 19–32)
CREATININE: 0.97 mg/dL (ref 0.40–1.20)
Calcium: 9.7 mg/dL (ref 8.4–10.5)
Chloride: 100 mEq/L (ref 96–112)
GFR: 79.05 mL/min (ref >60.00)
GLUCOSE: 81 mg/dL (ref 70–99)
POTASSIUM: 3.6 meq/L (ref 3.5–5.1)
SODIUM: 135 meq/L (ref 135–145)
TOTAL PROTEIN: 7.5 g/dL (ref 6.0–8.3)

## 2017-04-05 NOTE — Patient Instructions (Addendum)
It was very nice to see you today- congratulations on your upcoming graduation!!    I will be in touch with your labs, and we can plan to recheck in 6 months Please do call and schedule a GI appointment as soon as you are able

## 2017-05-04 MED FILL — FLUoxetine HCL 20 MG CAPS: 20 | 30 days supply | Qty: 60 | Fill #3

## 2017-05-22 ENCOUNTER — Encounter: Payer: Self-pay | Admitting: Family Medicine

## 2017-05-22 DIAGNOSIS — Z90711 Acquired absence of uterus with remaining cervical stump: Secondary | ICD-10-CM | POA: Insufficient documentation

## 2017-05-24 ENCOUNTER — Other Ambulatory Visit: Payer: Self-pay | Admitting: Family Medicine

## 2017-05-24 DIAGNOSIS — I10 Essential (primary) hypertension: Secondary | ICD-10-CM

## 2017-05-24 MED FILL — HYDROCHLOROTHIAZIDE 25 MG T: 25 | 60 days supply | Qty: 60 | Fill #0

## 2017-05-25 ENCOUNTER — Telehealth: Payer: Self-pay | Admitting: *Deleted

## 2017-05-25 NOTE — Telephone Encounter (Signed)
Contacted the patient that set up new appt for Thursday.

## 2017-05-25 NOTE — Telephone Encounter (Signed)
Called and moved appt on Thursday from the afternoon to the morning

## 2017-05-27 ENCOUNTER — Ambulatory Visit: Payer: BLUE CROSS/BLUE SHIELD | Attending: Gynecologic Oncology | Admitting: Gynecologic Oncology

## 2017-05-27 ENCOUNTER — Encounter: Payer: Self-pay | Admitting: Gynecologic Oncology

## 2017-05-27 VITALS — BP 140/88 | HR 80 | Temp 98.1°F | Resp 18 | Ht 59.0 in | Wt 151.0 lb

## 2017-05-27 DIAGNOSIS — Z8249 Family history of ischemic heart disease and other diseases of the circulatory system: Secondary | ICD-10-CM | POA: Diagnosis not present

## 2017-05-27 DIAGNOSIS — Z90711 Acquired absence of uterus with remaining cervical stump: Secondary | ICD-10-CM

## 2017-05-27 DIAGNOSIS — Z9889 Other specified postprocedural states: Secondary | ICD-10-CM | POA: Diagnosis not present

## 2017-05-27 DIAGNOSIS — Z90710 Acquired absence of both cervix and uterus: Secondary | ICD-10-CM | POA: Diagnosis not present

## 2017-05-27 DIAGNOSIS — Z833 Family history of diabetes mellitus: Secondary | ICD-10-CM | POA: Diagnosis not present

## 2017-05-27 DIAGNOSIS — N9489 Other specified conditions associated with female genital organs and menstrual cycle: Secondary | ICD-10-CM | POA: Diagnosis not present

## 2017-05-27 DIAGNOSIS — Z800 Family history of malignant neoplasm of digestive organs: Secondary | ICD-10-CM | POA: Insufficient documentation

## 2017-05-27 DIAGNOSIS — Z820 Family history of epilepsy and other diseases of the nervous system: Secondary | ICD-10-CM | POA: Diagnosis not present

## 2017-05-27 DIAGNOSIS — I10 Essential (primary) hypertension: Secondary | ICD-10-CM | POA: Insufficient documentation

## 2017-05-27 DIAGNOSIS — Z79899 Other long term (current) drug therapy: Secondary | ICD-10-CM | POA: Insufficient documentation

## 2017-05-27 DIAGNOSIS — K449 Diaphragmatic hernia without obstruction or gangrene: Secondary | ICD-10-CM | POA: Insufficient documentation

## 2017-05-27 DIAGNOSIS — K219 Gastro-esophageal reflux disease without esophagitis: Secondary | ICD-10-CM | POA: Diagnosis not present

## 2017-05-27 DIAGNOSIS — K5090 Crohn's disease, unspecified, without complications: Secondary | ICD-10-CM | POA: Diagnosis not present

## 2017-05-27 DIAGNOSIS — N939 Abnormal uterine and vaginal bleeding, unspecified: Secondary | ICD-10-CM

## 2017-05-27 NOTE — Progress Notes (Signed)
Consult Note: Gyn-Onc  Consult was requested by Dr. Aldine Contes the evaluation of [name] 46 y.o. female  CC:  Chief Complaint  Patient presents with  . Abnormal vaginal bleeding    Assessment/Plan:  [name]  is a 46 y.o.  year old with intermittent vaginal bleeding with history of supracervical hysterectomy.   We will follow-up the results of today's ECC. It is unlikely there is occult malignancy given her history of normal paps.  I discussed the 10% incidence of post-supracervical hysterecotmy bleeding and explained the phenomenon of residual endometrial glands within the lower uterine segment. I explained that this will resolve after menopause.  If her symptoms are bothersome enough that she would like intervention, there are two options. 1/ medical ovarian/menstrual suppression with continuous OCP's until age of natural menopause 2/ surgical trachelectomy. I discussed that this was the most radical procedure and one associated with significant risk for complication given her prior surgeries and the history of endometriosis. I discussed that there is particular risk for damage to the bladder or rectum with the dissection necessary for trachelectomy. Therefore, it should only be attempted if the symptoms are severe and the patient has failed or strongly declines medical suppression/intervention with OCP's.  [name] is will consider her options further and let me know if she elects for surgery versus OCP's versus expectant management when we call her.    HPI: The patient is a 46 year old G2P2 who is seen in consultation at the request of Dr Claiborne Billings for vaginal bleeding after supracervical hysterectomy.  The patient has a history of a surpracervical hysterectomy in 2013 for endometriosis. This resolved her pain symptoms.  She reports experiencing cyclical and intermittent brown vaginal spotting that is bothersome to her. This has been present since her procedure.   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  She has never had surgery for crohns disease however has had a suprapubic sling procedure and a laparoscopic supracervical hysterectomy. She has never had an abnormal pap smear including this year.  Current Meds:  Outpatient Encounter Prescriptions as of 47/28/2018  Medication Sig  . FLUoxetine (PROZAC) 20 MG capsule TAKE 2 CAPSULES (40 MG TOTAL) BY MOUTH DAILY. (Patient taking differently: Take 1 capsule daily)  . hydrochlorothiazide (HYDRODIURIL) 25 MG tablet TAKE 1/2 TABLET BY MOUTH DAILY INCREASE TO 1 TABLET BY MOUTH AS NEEDED  . Multiple Vitamin (MULTIVITAMIN) tablet Take 1 tablet by mouth daily.   No facility-administered encounter medications on file as of 47/28/2018.     Allergy: No Known Allergies  Social Hx:   Social History   Social History  . Marital status: Married    Spouse name: N/A  . Number of children: N/A  . Years of education: N/A   Occupational History  . Full time student    Social History Main Topics  . Smoking status: Never Smoker  . Smokeless tobacco: Never Used  . Alcohol use 0.6 - 1.2 oz/week    1 - 2 Standard drinks or equivalent per week     Comment: occasional wine  . Drug use: No  . Sexual activity: Not on file   Other Topics Concern  . Not on file   Social History Narrative  . No narrative on file    Past Surgical Hx:  Past Surgical History:  Procedure Laterality  Date  . CYSTOSCOPY  04/22/2012   Procedure: CYSTOSCOPY;  Surgeon: Garnett Farm, MD;  Location: [facility];  Service: Urology;  Laterality: N/A;  . LAPAROSCOPIC SUPRACERVICAL HYSTERECTOMY  04/22/2012   Procedure: LAPAROSCOPIC SUPRACERVICAL HYSTERECTOMY;  Surgeon: Oliver Pila, MD;  Location: [facility];  Service: Gynecology;  Laterality: N/A;  . PUBOVAGINAL SLING  04/22/2012   Procedure:  Leonides Grills;  Surgeon: Garnett Farm, MD;  Location: [facility];  Service: Urology;  Laterality: N/A;  superpubic sling  . vag del x2      Past Medical Hx:  Past Medical History:  Diagnosis Date  . Blood transfusion [address] [place]  . Crohn's disease (HCC)   . GERD (gastroesophageal reflux disease)    No meds  . H/O hiatal hernia   . Hypertension   . Shortness of breath     Past Gynecological History:  SVD x 2. hysterectomy No LMP recorded.  Family Hx:  Family History  Problem Relation Age of Onset  . Hypertension Mother   . Hypertension Father   . Diabetes Father   . Alzheimer's disease Father   . Throat cancer Father     Review of Systems:  Constitutional  Feels well,  ENT Normal appearing ears and nares bilaterally Skin/Breast  No rash, sores, jaundice, itching, dryness Cardiovascular  No chest pain, shortness of breath, or edema  Pulmonary  No cough or wheeze.  Gastro Intestinal  No nausea, vomitting, or diarrhoea. No bright red blood per rectum, no abdominal pain, change in bowel movement, or constipation.  Genito Urinary  No frequency, urgency, dysuria, + brown vaginal spotting Musculo Skeletal  No myalgia, arthralgia, joint swelling or pain  Neurologic  No weakness, numbness, change in gait,  Psychology  No depression, anxiety, insomnia.   Vitals:  Blood pressure 140/88, pulse 80, temperature 98.1 F (36.7 C), resp. rate 18, height 4\' 11"  (1.499 m), weight 151 lb (68.5 kg), SpO2 100 %.  Physical Exam: WD in NAD Neck  Supple NROM, without any enlargements.  Lymph Node Survey No cervical supraclavicular or inguinal adenopathy Cardiovascular  Pulse normal rate, regularity and rhythm. S1 and S2 normal.  Lungs  Clear to auscultation bilateraly, without wheezes/crackles/rhonchi. Good air movement.  Skin  No rash/lesions/breakdown  Psychiatry  Alert and oriented to person, place, and time  Abdomen  Normoactive bowel sounds,  abdomen soft, non-tender and mildly obese without evidence of hernia. Back No CVA tenderness Genito Urinary  Vulva/vagina: Normal external female genitalia.  No lesions. No discharge or bleeding.  Bladder/urethra:  No lesions or masses, well supported bladder  Vagina: grossly normal  Cervix: Normal appearing, no lesions.  Uterus: surgically absent.  Adnexa: no masses. Rectal  defereed Extremities  No bilateral cyanosis, clubbing or edema.   Quinn Axe, MD  05/28/2017, 5:48 PM

## 2017-05-27 NOTE — Patient Instructions (Signed)
Your 3 options for treatment include:   1/ watchful waiting with the expectation that after menopause (in approximately 3 years) the bleeding will stop on its own  2/ continuous birth control pills to suppress the bleeding.  3/ surgery with removal of the cervix.  We will call you with your results from your biopsy at which time you can let us know what you would prefer to do.

## 2017-05-28 ENCOUNTER — Telehealth: Payer: Self-pay

## 2017-05-28 ENCOUNTER — Encounter: Payer: Self-pay | Admitting: Gynecologic Oncology

## 2017-05-28 NOTE — Telephone Encounter (Signed)
Told Ms Menendez the results of the ECC as noted below by Warner Mccreedy, NP.  Ms Woolbright said that she would like to use the continuous birth control pills to suppress the bleeding. Medication can be sent in to [facility].  Told Pt Dr. Andrey Farmer will be in the office on July 2nd.  This office will call Ms Straley back with name of birth control and let her know if follow up with Dr. Andrey Farmer is needed.

## 2017-05-28 NOTE — Telephone Encounter (Signed)
-----   Message from [name], NP sent at 05/28/2017 11:40 AM EDT -----  ECC that was performed was benign.  Thank you ----- Message ----- From: Interface, Lab In Three [facility] Sent: 05/28/2017  11:33 AM To: [name], NP

## 2017-06-01 ENCOUNTER — Other Ambulatory Visit: Payer: Self-pay | Admitting: Gynecologic Oncology

## 2017-06-01 DIAGNOSIS — N926 Irregular menstruation, unspecified: Secondary | ICD-10-CM

## 2017-06-01 MED ORDER — LEVONORGEST-ETH ESTRAD 91-DAY 0.15-0.03 MG PO TABS: 1.0000 | ORAL_TABLET | Freq: Every day | ORAL | 6 refills | Status: AC

## 2017-06-01 MED FILL — INTROVALE 0.15-0.03 MG TAB: 0.15-0.03 | 91 days supply | Qty: 91 | Fill #0

## 2017-06-01 NOTE — Telephone Encounter (Signed)
Spoke with Ms Welshans and told her the birth control pills were sent in in to [facility].  She is to take pills for 12 weeks and continuously then break for 1 week and repeat. Reviewed signs and symptoms of blood clots in legs, swelling ,redness in calf, difficulty bearing weight.  This is a potential side effect of the pills. Pt verbalized understanding. Pt will start the pills on Sunday 06-06-17 as this is the day the pill pack starts. Asked patient to call in ~6 weeks to let Dr. Andrey Farmer know if the pills  effectively stopped the bleeding. Will follow up with Dr. Andrey Farmer on 06-04-17 to determine follow up care.

## 2017-06-01 NOTE — Telephone Encounter (Signed)
[name], NP  GYN Oncology    [] Hide copied text [] Hover for attribution information Seasonale sent in for patient per Dr. Andrey Farmer.    Electronically signed by [name], NP at 06/01/2017 12:08 PM

## 2017-06-01 NOTE — Progress Notes (Signed)
Seasonale sent in for patient per Dr. Andrey Farmer.

## 2017-06-10 MED FILL — FLUoxetine HCL 20 MG CAPS: 20 | 30 days supply | Qty: 60 | Fill #4

## 2017-07-16 MED FILL — FLUoxetine HCL 20 MG CAPS: 20 | 30 days supply | Qty: 60 | Fill #5

## 2017-07-19 NOTE — Telephone Encounter (Signed)
LM for patient to call the office to let Dr. Andrey Farmer know if the birth control pills have effectively stopped her bleeding.

## 2017-07-20 NOTE — Telephone Encounter (Signed)
Told [name] that Dr. Andrey Farmer said that she was glad the bleeding stopped.  She can follow up with Dr. Janee Morn.  No further follow up needed with Dr. Andrey Farmer.

## 2017-07-20 NOTE — Telephone Encounter (Signed)
[name] states that she has not had any bleeding since she began the birth control Pills-Seasonale.  Will discuss future follow up with Dr. Andrey Farmer and contact the patient.

## 2017-07-22 MED FILL — HYDROCHLOROTHIAZIDE 25 MG T: 25 | 60 days supply | Qty: 60 | Fill #1

## 2017-08-13 ENCOUNTER — Other Ambulatory Visit: Payer: Self-pay | Admitting: Family Medicine

## 2017-08-13 DIAGNOSIS — F341 Dysthymic disorder: Secondary | ICD-10-CM

## 2017-08-16 NOTE — Telephone Encounter (Signed)
Pt came in office verify the status a refill, pt would like to have it sent to pharmacy [facility]. Please advise.

## 2017-08-18 ENCOUNTER — Other Ambulatory Visit: Payer: Self-pay | Admitting: Emergency Medicine

## 2017-08-18 DIAGNOSIS — F341 Dysthymic disorder: Secondary | ICD-10-CM

## 2017-08-18 MED ORDER — FLUOXETINE HCL 20 MG PO CAPS: 20.0000 mg | ORAL_CAPSULE | Freq: Every day | ORAL | 1 refills | Status: DC

## 2017-08-18 MED FILL — FLUoxetine HCL 20 MG CAPS: 20 | 90 days supply | Qty: 90 | Fill #0

## 2017-08-18 NOTE — Telephone Encounter (Signed)
Refill sent to pharmacy per pt request.

## 2017-09-30 MED FILL — HYDROCHLOROTHIAZIDE 25 MG T: 25 | 60 days supply | Qty: 60 | Fill #2

## 2017-10-03 NOTE — Progress Notes (Deleted)
[facility] [address], [address] [place], [place] [address] [phone]  Date:  10/06/2017   Name:  [name]   DOB:  11-14-1971   MRN:  [id number]  PCP:  Pearline Cables, MD    Chief Complaint: No chief complaint on file.   History of Present Illness:  [name] is a 46 y.o. very pleasant female patient who presents with the following:  ***  Patient Active Problem List   Diagnosis Date Noted  . Status post laparoscopic supracervical hysterectomy 05/22/2017  . Pre-diabetes 04/05/2017  . Nephrolithiasis 07/02/2016  . Essential hypertension 05/14/2016  . Irregular menstrual bleeding 05/14/2016  . Crohn's disease (HCC) 05/14/2016    Past Medical History:  Diagnosis Date  . Blood transfusion [address], [place]  . Crohn's disease (HCC)   . GERD (gastroesophageal reflux disease)    No meds  . H/O hiatal hernia   . Hypertension   . Shortness of breath     Past Surgical History:  Procedure Laterality Date  . vag del x2      Social History   Tobacco Use  . Smoking status: Never Smoker  . Smokeless tobacco: Never Used  Substance Use Topics  . Alcohol use: Yes    Alcohol/week: 0.6 - 1.2 oz    Types: 1 - 2 Standard drinks or equivalent per week    Comment: occasional wine  . Drug use: No    Family History  Problem Relation Age of Onset  . Hypertension Mother   . Hypertension Father   . Diabetes Father   . Alzheimer's disease Father   . Throat cancer Father     No Known Allergies  Medication list has been reviewed and updated.  Current Outpatient Medications on File Prior to Visit  Medication Sig Dispense Refill  . FLUoxetine (PROZAC) 20 MG capsule Take 1 capsule (20 mg total) by mouth daily. 90 capsule 1  . hydrochlorothiazide (HYDRODIURIL) 25 MG tablet TAKE 1/2 TABLET BY MOUTH DAILY INCREASE TO 1 TABLET BY MOUTH AS NEEDED 60 tablet 11  . levonorgestrel-ethinyl estradiol  (SEASONALE,INTROVALE,JOLESSA) 0.15-0.03 MG tablet Take 1 tablet by mouth daily. 1 Package 6  . Multiple Vitamin (MULTIVITAMIN) tablet Take 1 tablet by mouth daily.     No current facility-administered medications on file prior to visit.     Review of Systems:  ***  Physical Examination: There were no vitals filed for this visit. There were no vitals filed for this visit. There is no height or weight on file to calculate BMI. Ideal Body Weight:    ***  Assessment and Plan: ***  Signed Abbe Amsterdam, MD

## 2017-10-06 ENCOUNTER — Ambulatory Visit: Payer: BLUE CROSS/BLUE SHIELD | Admitting: Family Medicine

## 2018-01-10 MED FILL — HYDROCHLOROTHIAZIDE 25 MG T: 25 | 60 days supply | Qty: 60 | Fill #3

## 2018-03-24 MED FILL — HYDROCHLOROTHIAZIDE 25 MG T: 25 | 60 days supply | Qty: 60 | Fill #4

## 2018-03-24 MED FILL — FLUoxetine HCL 20 MG CAPS: 20 | 90 days supply | Qty: 90 | Fill #1

## 2018-06-15 ENCOUNTER — Other Ambulatory Visit: Payer: Self-pay | Admitting: Family Medicine

## 2018-06-15 DIAGNOSIS — I10 Essential (primary) hypertension: Secondary | ICD-10-CM

## 2018-06-15 MED FILL — HYDROCHLOROTHIAZIDE 25 MG T: 25 | 60 days supply | Qty: 60 | Fill #0

## 2018-07-12 ENCOUNTER — Other Ambulatory Visit: Payer: Self-pay | Admitting: Family Medicine

## 2018-07-12 DIAGNOSIS — F341 Dysthymic disorder: Secondary | ICD-10-CM

## 2018-07-12 MED FILL — FLUoxetine HCL 20 MG CAPS: 20 | 30 days supply | Qty: 30 | Fill #0

## 2018-08-23 ENCOUNTER — Other Ambulatory Visit: Payer: Self-pay | Admitting: Family Medicine

## 2018-08-23 DIAGNOSIS — I10 Essential (primary) hypertension: Secondary | ICD-10-CM

## 2018-08-24 MED FILL — HYDROCHLOROTHIAZIDE 25 MG T: 25 | 30 days supply | Qty: 30 | Fill #0

## 2018-10-04 ENCOUNTER — Encounter: Payer: Self-pay | Admitting: Family Medicine

## 2018-10-04 ENCOUNTER — Other Ambulatory Visit: Payer: Self-pay | Admitting: Family Medicine

## 2018-10-04 DIAGNOSIS — I10 Essential (primary) hypertension: Secondary | ICD-10-CM

## 2018-10-05 MED FILL — HYDROCHLOROTHIAZIDE 25 MG T: 25 | 30 days supply | Qty: 30 | Fill #0

## 2018-11-22 ENCOUNTER — Other Ambulatory Visit: Payer: Self-pay | Admitting: Family Medicine

## 2018-11-22 DIAGNOSIS — I10 Essential (primary) hypertension: Secondary | ICD-10-CM

## 2018-11-24 MED FILL — HYDROCHLOROTHIAZIDE 25 MG T: 25 | 15 days supply | Qty: 15 | Fill #0

## 2018-12-14 ENCOUNTER — Other Ambulatory Visit: Payer: Self-pay | Admitting: Family Medicine

## 2018-12-14 DIAGNOSIS — I10 Essential (primary) hypertension: Secondary | ICD-10-CM

## 2018-12-14 MED FILL — HYDROCHLOROTHIAZIDE 25 MG T: 25 | 7 days supply | Qty: 7 | Fill #0
